# Patient Record
Sex: Female | Born: 1980 | Hispanic: Yes | Marital: Single | Smoking: Never smoker
Health system: Southern US, Community
[De-identification: ages and names within clinical notes are randomized; demographics above are authoritative.]

## PROBLEM LIST (undated history)

## (undated) ENCOUNTER — Inpatient Hospital Stay (HOSPITAL_COMMUNITY): Payer: Self-pay

## (undated) DIAGNOSIS — Z789 Other specified health status: Secondary | ICD-10-CM

---

## 2001-01-29 ENCOUNTER — Inpatient Hospital Stay (HOSPITAL_COMMUNITY): Admission: AD | Admit: 2001-01-29 | Discharge: 2001-01-29 | Payer: Self-pay | Admitting: *Deleted

## 2001-01-29 ENCOUNTER — Encounter: Payer: Self-pay | Admitting: Obstetrics

## 2001-04-30 ENCOUNTER — Encounter: Admission: RE | Admit: 2001-04-30 | Discharge: 2001-04-30 | Payer: Self-pay | Admitting: Family Medicine

## 2001-05-30 ENCOUNTER — Encounter: Admission: RE | Admit: 2001-05-30 | Discharge: 2001-05-30 | Payer: Self-pay | Admitting: Family Medicine

## 2001-06-09 ENCOUNTER — Ambulatory Visit (HOSPITAL_COMMUNITY): Admission: RE | Admit: 2001-06-09 | Discharge: 2001-06-09 | Payer: Self-pay

## 2001-07-22 ENCOUNTER — Encounter: Admission: RE | Admit: 2001-07-22 | Discharge: 2001-07-22 | Payer: Self-pay | Admitting: Sports Medicine

## 2001-08-29 ENCOUNTER — Encounter: Admission: RE | Admit: 2001-08-29 | Discharge: 2001-08-29 | Payer: Self-pay | Admitting: Family Medicine

## 2001-09-05 ENCOUNTER — Encounter: Admission: RE | Admit: 2001-09-05 | Discharge: 2001-09-05 | Payer: Self-pay | Admitting: Family Medicine

## 2001-09-10 ENCOUNTER — Encounter: Admission: RE | Admit: 2001-09-10 | Discharge: 2001-09-10 | Payer: Self-pay | Admitting: Family Medicine

## 2001-09-19 ENCOUNTER — Encounter: Admission: RE | Admit: 2001-09-19 | Discharge: 2001-09-19 | Payer: Self-pay | Admitting: Family Medicine

## 2001-09-26 ENCOUNTER — Encounter: Admission: RE | Admit: 2001-09-26 | Discharge: 2001-09-26 | Payer: Self-pay | Admitting: Family Medicine

## 2001-09-27 ENCOUNTER — Inpatient Hospital Stay (HOSPITAL_COMMUNITY): Admission: AD | Admit: 2001-09-27 | Discharge: 2001-09-27 | Payer: Self-pay | Admitting: *Deleted

## 2001-09-29 ENCOUNTER — Inpatient Hospital Stay (HOSPITAL_COMMUNITY): Admission: AD | Admit: 2001-09-29 | Discharge: 2001-10-04 | Payer: Self-pay | Admitting: *Deleted

## 2002-04-11 ENCOUNTER — Emergency Department (HOSPITAL_COMMUNITY): Admission: EM | Admit: 2002-04-11 | Discharge: 2002-04-11 | Payer: Self-pay | Admitting: Emergency Medicine

## 2002-05-03 ENCOUNTER — Emergency Department (HOSPITAL_COMMUNITY): Admission: EM | Admit: 2002-05-03 | Discharge: 2002-05-04 | Payer: Self-pay | Admitting: Emergency Medicine

## 2003-12-09 ENCOUNTER — Ambulatory Visit (HOSPITAL_COMMUNITY): Admission: RE | Admit: 2003-12-09 | Discharge: 2003-12-09 | Payer: Self-pay | Admitting: *Deleted

## 2004-04-13 ENCOUNTER — Ambulatory Visit: Payer: Self-pay | Admitting: *Deleted

## 2004-04-13 ENCOUNTER — Inpatient Hospital Stay (HOSPITAL_COMMUNITY): Admission: AD | Admit: 2004-04-13 | Discharge: 2004-04-17 | Payer: Self-pay | Admitting: *Deleted

## 2007-12-20 ENCOUNTER — Other Ambulatory Visit: Payer: Self-pay | Admitting: Emergency Medicine

## 2007-12-20 ENCOUNTER — Ambulatory Visit: Payer: Self-pay | Admitting: Gynecology

## 2007-12-20 ENCOUNTER — Inpatient Hospital Stay (HOSPITAL_COMMUNITY): Admission: AD | Admit: 2007-12-20 | Discharge: 2007-12-24 | Payer: Self-pay | Admitting: Obstetrics & Gynecology

## 2007-12-23 ENCOUNTER — Encounter (INDEPENDENT_AMBULATORY_CARE_PROVIDER_SITE_OTHER): Payer: Self-pay | Admitting: Gynecology

## 2008-02-09 ENCOUNTER — Inpatient Hospital Stay (HOSPITAL_COMMUNITY): Admission: AD | Admit: 2008-02-09 | Discharge: 2008-02-09 | Payer: Self-pay | Admitting: Family Medicine

## 2009-11-03 ENCOUNTER — Emergency Department (HOSPITAL_COMMUNITY): Admission: EM | Admit: 2009-11-03 | Discharge: 2009-11-03 | Payer: Self-pay | Admitting: Family Medicine

## 2009-12-19 ENCOUNTER — Inpatient Hospital Stay (HOSPITAL_COMMUNITY): Admission: AD | Admit: 2009-12-19 | Discharge: 2009-12-19 | Payer: Self-pay | Admitting: Family Medicine

## 2010-02-17 ENCOUNTER — Inpatient Hospital Stay (HOSPITAL_COMMUNITY)
Admission: AD | Admit: 2010-02-17 | Discharge: 2010-02-18 | Payer: Self-pay | Source: Home / Self Care | Admitting: Obstetrics & Gynecology

## 2010-02-17 ENCOUNTER — Ambulatory Visit: Payer: Self-pay | Admitting: Obstetrics & Gynecology

## 2010-03-01 ENCOUNTER — Ambulatory Visit: Payer: Self-pay | Admitting: Obstetrics and Gynecology

## 2010-03-01 LAB — CONVERTED CEMR LAB
Clue Cells Wet Prep HPF POC: NONE SEEN
Trich, Wet Prep: NONE SEEN

## 2010-03-02 ENCOUNTER — Encounter: Payer: Self-pay | Admitting: Obstetrics and Gynecology

## 2010-03-02 LAB — CONVERTED CEMR LAB
Antibody Screen: NEGATIVE
Basophils Absolute: 0 K/uL
Basophils Relative: 0 %
Eosinophils Absolute: 0.4 K/uL
Eosinophils Relative: 4 %
HCT: 41.8 %
Hemoglobin: 13.7 g/dL
Hepatitis B Surface Ag: NEGATIVE
Hgb A2 Quant: 2.6 %
Hgb A: 97.4 %
Hgb F Quant: 0 %
Hgb S Quant: 0 %
Lymphocytes Relative: 21 %
Lymphs Abs: 2.1 K/uL
MCHC: 32.8 g/dL
MCV: 90.7 fL
Monocytes Absolute: 0.5 K/uL
Monocytes Relative: 5 %
Neutro Abs: 7.2 K/uL
Neutrophils Relative %: 70 %
Platelets: 220 K/uL
RBC: 4.61 M/uL
RDW: 15 %
Rh Type: POSITIVE
Rubella: >500 [IU]/mL — ABNORMAL HIGH
WBC: 10.2 10*3/microliter

## 2010-03-06 ENCOUNTER — Ambulatory Visit: Payer: Self-pay | Admitting: Obstetrics & Gynecology

## 2010-03-13 ENCOUNTER — Ambulatory Visit (HOSPITAL_COMMUNITY): Admission: RE | Admit: 2010-03-13 | Discharge: 2010-03-13 | Payer: Self-pay | Admitting: Anesthesiology

## 2010-03-20 ENCOUNTER — Encounter (INDEPENDENT_AMBULATORY_CARE_PROVIDER_SITE_OTHER): Payer: Self-pay | Admitting: *Deleted

## 2010-03-20 ENCOUNTER — Ambulatory Visit: Payer: Self-pay | Admitting: Obstetrics and Gynecology

## 2010-03-20 LAB — CONVERTED CEMR LAB
Albumin: 3.4 g/dL — ABNORMAL LOW (ref 3.5–5.2)
Alkaline Phosphatase: 58 units/L (ref 39–117)
BUN: 8 mg/dL (ref 6–23)
CO2: 21 meq/L (ref 19–32)
Calcium: 8.5 mg/dL (ref 8.4–10.5)
Chloride: 109 meq/L (ref 96–112)
Creatinine, Ser: 0.4 mg/dL (ref 0.40–1.20)
Potassium: 4.1 meq/L (ref 3.5–5.3)
Total Bilirubin: 0.3 mg/dL (ref 0.3–1.2)
Total Protein: 6.2 g/dL (ref 6.0–8.3)

## 2010-04-03 ENCOUNTER — Ambulatory Visit: Payer: Self-pay | Admitting: Obstetrics & Gynecology

## 2010-04-04 ENCOUNTER — Encounter: Payer: Self-pay | Admitting: Obstetrics & Gynecology

## 2010-04-10 ENCOUNTER — Ambulatory Visit: Payer: Self-pay | Admitting: Obstetrics & Gynecology

## 2010-04-11 ENCOUNTER — Encounter: Payer: Self-pay | Admitting: Obstetrics & Gynecology

## 2010-04-11 ENCOUNTER — Ambulatory Visit (HOSPITAL_COMMUNITY): Admission: RE | Admit: 2010-04-11 | Discharge: 2010-04-11 | Payer: Self-pay | Admitting: Family Medicine

## 2010-04-17 ENCOUNTER — Ambulatory Visit: Payer: Self-pay | Admitting: Family Medicine

## 2010-04-24 ENCOUNTER — Encounter: Payer: Self-pay | Admitting: Obstetrics & Gynecology

## 2010-04-24 ENCOUNTER — Ambulatory Visit: Payer: Self-pay | Admitting: Obstetrics and Gynecology

## 2010-04-24 LAB — CONVERTED CEMR LAB
Hemoglobin: 12.8 g/dL (ref 12.0–15.0)
MCHC: 34 g/dL (ref 30.0–36.0)
MCV: 91.3 fL (ref 78.0–100.0)
RDW: 13.9 % (ref 11.5–15.5)

## 2010-04-25 ENCOUNTER — Encounter: Payer: Self-pay | Admitting: Obstetrics & Gynecology

## 2010-04-26 ENCOUNTER — Encounter: Payer: Self-pay | Admitting: Obstetrics & Gynecology

## 2010-04-26 ENCOUNTER — Ambulatory Visit: Payer: Self-pay | Admitting: Obstetrics and Gynecology

## 2010-05-08 ENCOUNTER — Ambulatory Visit: Payer: Self-pay | Admitting: Obstetrics & Gynecology

## 2010-05-09 ENCOUNTER — Ambulatory Visit (HOSPITAL_COMMUNITY): Admission: RE | Admit: 2010-05-09 | Discharge: 2010-05-09 | Payer: Self-pay | Admitting: Family Medicine

## 2010-05-15 ENCOUNTER — Ambulatory Visit: Payer: Self-pay | Admitting: Family Medicine

## 2010-05-15 LAB — CONVERTED CEMR LAB
Trich, Wet Prep: NONE SEEN
Yeast Wet Prep HPF POC: NONE SEEN

## 2010-05-29 ENCOUNTER — Encounter (INDEPENDENT_AMBULATORY_CARE_PROVIDER_SITE_OTHER): Payer: Self-pay | Admitting: *Deleted

## 2010-05-29 ENCOUNTER — Ambulatory Visit: Payer: Self-pay | Admitting: Obstetrics & Gynecology

## 2010-05-29 LAB — CONVERTED CEMR LAB

## 2010-06-05 ENCOUNTER — Ambulatory Visit: Payer: Self-pay | Admitting: Obstetrics and Gynecology

## 2010-06-19 ENCOUNTER — Ambulatory Visit: Payer: Self-pay | Admitting: Obstetrics & Gynecology

## 2010-06-20 ENCOUNTER — Encounter: Payer: Self-pay | Admitting: Obstetrics & Gynecology

## 2010-06-21 ENCOUNTER — Inpatient Hospital Stay (HOSPITAL_COMMUNITY): Admission: AD | Admit: 2010-06-21 | Discharge: 2010-06-22 | Payer: Self-pay | Admitting: Obstetrics and Gynecology

## 2010-06-21 ENCOUNTER — Ambulatory Visit: Payer: Self-pay | Admitting: Family Medicine

## 2010-06-26 ENCOUNTER — Ambulatory Visit: Payer: Self-pay | Admitting: Family Medicine

## 2010-07-03 ENCOUNTER — Ambulatory Visit: Payer: Self-pay | Admitting: Obstetrics & Gynecology

## 2010-07-10 ENCOUNTER — Ambulatory Visit: Payer: Self-pay | Admitting: Obstetrics and Gynecology

## 2010-07-11 ENCOUNTER — Inpatient Hospital Stay (HOSPITAL_COMMUNITY)
Admission: AD | Admit: 2010-07-11 | Discharge: 2010-07-11 | Payer: Self-pay | Source: Home / Self Care | Admitting: Obstetrics & Gynecology

## 2010-07-11 ENCOUNTER — Ambulatory Visit: Payer: Self-pay | Admitting: Family

## 2010-07-12 ENCOUNTER — Ambulatory Visit: Payer: Self-pay | Admitting: Obstetrics and Gynecology

## 2010-07-12 ENCOUNTER — Inpatient Hospital Stay (HOSPITAL_COMMUNITY)
Admission: AD | Admit: 2010-07-12 | Discharge: 2010-07-14 | Payer: Self-pay | Source: Home / Self Care | Admitting: Obstetrics & Gynecology

## 2010-08-16 ENCOUNTER — Ambulatory Visit: Payer: Self-pay | Admitting: Obstetrics and Gynecology

## 2010-08-16 ENCOUNTER — Encounter: Payer: Self-pay | Admitting: Obstetrics and Gynecology

## 2010-08-16 LAB — CONVERTED CEMR LAB
MCV: 90.4 fL (ref 78.0–100.0)
RBC: 4.25 M/uL (ref 3.87–5.11)
WBC: 6.9 10*3/uL (ref 4.0–10.5)

## 2010-08-30 ENCOUNTER — Ambulatory Visit
Admission: RE | Admit: 2010-08-30 | Discharge: 2010-08-30 | Payer: Self-pay | Source: Home / Self Care | Attending: Obstetrics and Gynecology | Admitting: Obstetrics and Gynecology

## 2010-11-07 LAB — RPR: RPR Ser Ql: NONREACTIVE

## 2010-11-07 LAB — POCT URINALYSIS DIPSTICK
Bilirubin Urine: NEGATIVE
Bilirubin Urine: NEGATIVE
Glucose, UA: 100 mg/dL — AB
Ketones, ur: NEGATIVE mg/dL
Protein, ur: NEGATIVE mg/dL
Specific Gravity, Urine: 1.025 (ref 1.005–1.030)
Specific Gravity, Urine: >=1.03 (ref 1.005–1.030)
Urobilinogen, UA: 0.2 mg/dL (ref 0.0–1.0)

## 2010-11-07 LAB — CBC
HCT: 40.9 % (ref 36.0–46.0)
MCH: 31.3 pg (ref 26.0–34.0)
MCV: 92 fL (ref 78.0–100.0)
RBC: 4.45 MIL/uL (ref 3.87–5.11)
RDW: 12.9 % (ref 11.5–15.5)

## 2010-11-08 LAB — POCT URINALYSIS DIPSTICK
Bilirubin Urine: NEGATIVE
Bilirubin Urine: NEGATIVE
Glucose, UA: >=1000 mg/dL — AB
Glucose, UA: NEGATIVE mg/dL
Ketones, ur: NEGATIVE mg/dL
Nitrite: NEGATIVE
Nitrite: NEGATIVE
Protein, ur: NEGATIVE mg/dL
Protein, ur: NEGATIVE mg/dL
Specific Gravity, Urine: >=1.03 (ref 1.005–1.030)
Urobilinogen, UA: 1 mg/dL (ref 0.0–1.0)
pH: 6 (ref 5.0–8.0)
pH: 6 (ref 5.0–8.0)

## 2010-11-09 LAB — POCT URINALYSIS DIPSTICK
Bilirubin Urine: NEGATIVE
Bilirubin Urine: NEGATIVE
Bilirubin Urine: NEGATIVE
Glucose, UA: NEGATIVE mg/dL
Glucose, UA: NEGATIVE mg/dL
Ketones, ur: NEGATIVE mg/dL
Ketones, ur: NEGATIVE mg/dL
Nitrite: NEGATIVE
Nitrite: NEGATIVE
Protein, ur: NEGATIVE mg/dL
Protein, ur: NEGATIVE mg/dL
Specific Gravity, Urine: 1.02 (ref 1.005–1.030)
Specific Gravity, Urine: 1.015 (ref 1.005–1.030)
Specific Gravity, Urine: 1.015 (ref 1.005–1.030)
Urobilinogen, UA: 0.2 mg/dL (ref 0.0–1.0)
pH: 5.5 (ref 5.0–8.0)
pH: 6.5 (ref 5.0–8.0)
pH: 6.5 (ref 5.0–8.0)

## 2010-11-11 LAB — POCT URINALYSIS DIP (DEVICE)
Bilirubin Urine: NEGATIVE
Ketones, ur: NEGATIVE mg/dL
Nitrite: NEGATIVE
Specific Gravity, Urine: 1.025 (ref 1.005–1.030)
Urobilinogen, UA: 0.2 mg/dL (ref 0.0–1.0)
pH: 6 (ref 5.0–8.0)

## 2010-11-12 LAB — URINALYSIS, ROUTINE W REFLEX MICROSCOPIC
Bilirubin Urine: NEGATIVE
Glucose, UA: NEGATIVE mg/dL
Ketones, ur: NEGATIVE mg/dL
Nitrite: NEGATIVE
Specific Gravity, Urine: 1.025 (ref 1.005–1.030)

## 2010-11-12 LAB — POCT URINALYSIS DIP (DEVICE): pH: 5.5 (ref 5.0–8.0)

## 2010-11-12 LAB — CBC
Hemoglobin: 12.9 g/dL (ref 12.0–15.0)
MCH: 31.2 pg (ref 26.0–34.0)
MCHC: 34.6 g/dL (ref 30.0–36.0)
RDW: 14.2 % (ref 11.5–15.5)
WBC: 11.3 10*3/uL — ABNORMAL HIGH (ref 4.0–10.5)

## 2010-11-12 LAB — GC/CHLAMYDIA PROBE AMP, GENITAL
Chlamydia, DNA Probe: NEGATIVE
GC Probe Amp, Genital: NEGATIVE

## 2010-11-12 LAB — WET PREP, GENITAL: Trich, Wet Prep: NONE SEEN

## 2010-11-12 LAB — SURGICAL PCR SCREEN: MRSA, PCR: NEGATIVE

## 2010-11-14 LAB — WET PREP, GENITAL: Yeast Wet Prep HPF POC: NONE SEEN

## 2010-11-20 LAB — POCT RAPID STREP A (OFFICE): Streptococcus, Group A Screen (Direct): POSITIVE — AB

## 2011-01-09 NOTE — Op Note (Signed)
Amy [name], Amy [name]           ACCOUNT NO.:  [id number]   MEDICAL RECORD NO.:  [id number]          PATIENT TYPE:  INP   LOCATION:                                FACILITY:  [facility]   PHYSICIAN:  Ginger Carne, MD  DATE OF BIRTH:  May 04, 1981   DATE OF PROCEDURE:  12/23/2007  DATE OF DISCHARGE:                               OPERATIVE REPORT   PREOPERATIVE DIAGNOSIS:  An 18-week incomplete abortion with retained  placenta.   POSTOPERATIVE DIAGNOSIS:  An 18-week incomplete abortion with retained  placenta.   PROCEDURE:  1. Aspiration.  2. Dilation.  3. Extraction.   SURGEON:  Ginger Carne, MD   ASSISTANT:  None.   COMPLICATIONS:  None immediate.   ESTIMATED BLOOD LOSS:  Less than 100 mL.   ANESTHESIA:  General.   OPERATIVE FINDINGS:  Fetus had been expelled prior to procedure and  placental tissue noted.  All tissue was removed and minimal bleeding  noted at the end of the procedure.   SPECIMENS TO PATHOLOGY:  The patient is Rh positive.  Uterus palpated to  approximately 16 weeks.  Both adnexa appeared to be normal.  External  genitalia, vulva, and vagina were normal.  Cervix was smooth without  erosions or lesions.   OPERATIVE PROCEDURE:  The patient prepped and draped in usual fashion  and placed in the lithotomy position.  Betadine solution used for  antiseptic and the patient was catheterized prior to the procedure.  After adequate general anesthesia, tenaculum placed on the anterior lip  of the cervix.  A #12 suction cannulae utilized including a banjo  curette to remove intrauterine contents.  All placental tissue removed,  sent to pathology.  Minimal bleeding noted at the end of the procedure.  The patient tolerated the procedure well and returned to the post  anesthesia recovery room in excellent condition.     Ginger Carne, MD  Electronically Signed    SHB/MEDQ  D:  12/23/2007  T:  12/24/2007  Job:  [id number]

## 2011-01-09 NOTE — Discharge Summary (Signed)
NAMEADRIENNA, Amy [name]           ACCOUNT NO.:  [id number]   MEDICAL RECORD NO.:  [id number]          PATIENT TYPE:  INP   LOCATION:  9315                          FACILITY:  [facility]   PHYSICIAN:  Ginger Carne, MD  DATE OF BIRTH:  1981-04-04   DATE OF ADMISSION:  12/20/2007  DATE OF DISCHARGE:                               DISCHARGE SUMMARY   REASON FOR HOSPITALIZATION:  18 week second trimester cervical  incompetence.   IN HOSPITAL PROCEDURES:  Dilation and curettage following incomplete  abortion.   FINAL DIAGNOSIS:  Second trimester incomplete abortion and cervical  incompetence   HOSPITAL COURSE:  This is a 30 year old Hispanic female, Rh positive,  who presented with a chief complaint of pelvic pressure, on the December 20, 2007.  On pelvic examination, the patient was noted to have no  visible cervix with hour glassing of the membranes.  She was dated at 18  weeks.  Her cervix was approximately 5 cm dilated, at the time.  The  patient had a spontaneously delivered said fetus, on December 23, 2007.  She was given Cytotec complementation for delivery of the placenta,  which did not occur, and in the late afternoon of December 23, 2007, the  patient was taken to the operating room, at which time a dilation and  curettage with suction was performed.  All contents removed.  Postoperatively, the patient did well.  She was afebrile, voided well.  Scant vaginal flow and no abdominal or uterine tenderness.   The patient's blood type was A positive.  She had a negative Chlamydia  and gonorrhea probe, and her hemoglobin, on admission was 12.2.   The patient was requested to follow up at [facility], [facility], in 4-6 weeks, for follow up and for  discussion of contraception.  In addition, she was requested to take 600  mg of ibuprofen every 6 hours, as needed for pain.  All questions were  answered.  Instructions were given to the Spanish interpreter.      Ginger Carne, MD  Electronically Signed     SHB/MEDQ  D:  12/24/2007  T:  12/24/2007  Job:  [id number]

## 2011-01-12 NOTE — Discharge Summary (Signed)
[facility]  Patient:    Amy [name], Amy [name] Visit Number: [id number] MRN: [id number]          Service Type: OBS Location: [id number] Attending Physician:  Michaelle Copas Dictated by:   Kevin Fenton, M.D. Admit Date:  09/29/2001 Discharge Date: 10/04/2001                             Discharge Summary  DISCHARGE MEDICATIONS:        1. Augmentin 875 mg p.o. b.i.d. x 10 days.                               2. Percocet 5/325 two pills q.4h. p.r.n. severe                                  pain.                               3. Ibuprofen 600 mg one pill every six hours                                  as needed.                               4. Prenatal vitamin one a day until the bottle                                  she has is finished.  DISCHARGE FOLLOWUP:           The patient should follow up at the [facility] in six weeks.  DISCHARGE DIAGNOSES:          1. Term pregnancy, delivered.                               2. Primary low transverse cesarean section.                               3. Failure to progress.  BRIEF HOSPITAL COURSE:        This is a 30 year old, G1, now P55, who presented in active labor on September 29, 2001. She underwent spontaneous rupture of membranes. She is GBS negative. The patient did receive an epidural. Her labor was augmented with Pitocin. However, the patient had failure to progress past 9 cm and so the patient was taken to the OR for a primary low transverse cesarean section. The patient delivered a 9-pound 2-ounce female with Apgars of 9 at one minute and 9 at five minutes under epidural anesthesia. She had a postoperative course complicated by fever for which she received Cefotetan x three days. She was afebrile x 48 hours at discharge. She is breast and bottle feeding. She desires no contraception. She is A positive, rubella immune. She does not desire circumcision for the  baby. Dictated by:   Kevin Fenton, M.D. Attending Physician:  Michaelle Copas DD:  10/04/01 TD:  10/05/01  Job: [id number]

## 2011-01-12 NOTE — Discharge Summary (Signed)
[name], [name]           ACCOUNT NO.:  [id number]   MEDICAL RECORD NO.:  [id number]           PATIENT TYPE:   LOCATION:                                 FACILITY:   PHYSICIAN:  Adrian Blackwater, MDDATE OF BIRTH:  1981-07-10   DATE OF ADMISSION:  04/13/2004  DATE OF DISCHARGE:  04/17/2004                                 DISCHARGE SUMMARY   This is a 30 year old, G2, P1-1 who presents at 49 weeks and 2 days  gestational age with spontaneous rupture of membranes.  Patient has prior  history of LGA infant and anemia and previous cesarean section due to arrest  of dilation.  Baby's weight was 9 pounds 20 ounces.  This was a pregnancy  with late prenatal care at 21 weeks and anemia but no other complications.  Patient was admitted for expectant management.  She was started on Pitocin  to accelerate labor.  Also an epidural was placed by request.  Labor  progressed slowly with fetal brady that reassured after scalp stimulation.  During active phase of labor, patient developed temperature up to 98 degrees  to 101.6 on April 14, 2004, at 17:35 with fetal tachycardia.  Ampicillin  plus gentamicin was started with expectant management.  Patient delivered on  April 14, 2004 by vaginal delivery with third-degree lacerations and  repair.  In the last 24 hours patient has oliguria probably secondary to  urinary retention and dehydration.  She was bolused one liter of lactated  ringers with post-bolus voiding normal.  During postpartum day #2, the  patient has orthostatic change.  Lochia was normal.  There was only mild  tenderness on the abdomen and patient remained afebrile.   DISCHARGE LABS:  White blood cell 14.4, hemoglobin 8.1, hematocrit 23.8,  platelets 194, neutrophils 12.7.   OBSTETRICAL DELIVERY RECORD:  Patient delivered on April 14, 2004, at 2000  hours female baby with Apgar 7 at 1 minute, 8 at 5 minutes, also temperature  101.2.  Baby's weight was 6 pounds and 12 ounces  or 3065 grams.  He was  delivered by forceps-assisted vaginal delivery by Dr. Lelon Huh and there  was an occipital hematoma noted at delivery that was later evaluated as a  possible hemangioma with followup.   DISCHARGE MEDICATIONS:  1.  Percocet 5 and 325 mg 2 tablets p.o. q.4-6h. p.r.n. pain.  2.  Ibuprofen 600 mg 1 p.o. q.6h. pain.  3.  For anemia, ferrous sulfate 1 tablet b.i.d.   FOLLOWUP:  A follow-up appointment was scheduled in 6 weeks at [facility].   CONTRACEPTION:  Patient received Depo shot.            ___________________________________________  Adrian Blackwater, MD    IM/MEDQ  D:  07/05/2004  T:  07/06/2004  Job:  [id number]

## 2011-01-12 NOTE — Op Note (Signed)
[facility]  Patient:    Amy [name], Amy [name] Visit Number: [id number] MRN: [id number]          Service Type: OBS Location: [phone] Attending Physician:  Michaelle Copas Dictated by:   Georgina Peer, M.D. Proc. Date: 09/30/01 Admit Date:  09/29/2001   CC:         Conni Elliot, M.D.   Operative Report  PREOPERATIVE DIAGNOSES:       1. Term intrauterine pregnancy in active phase                                  of arrest.                               2. Failure to progress.                               3. Suspected macrosomia.                               4. Maternal fever.  POSTOPERATIVE DIAGNOSES:      1. Term intrauterine pregnancy in active phase                                  of arrest.                               2. Failure to progress.                               3. Suspected macrosomia.                               4. Maternal fever.  OPERATION:                    Low cervical transverse cesarean section.  SURGEON:                      Georgina Peer, M.D.  ASSISTANT:                    Kevin Fenton, M.D.  ANESTHESIA:                   Spinal - Leilani Able, Montez Hageman., M.D.  ESTIMATED BLOOD LOSS:         1200 cc.  FINDINGS:                     A 9 pound 2 ounce female delivered at 4:01 a.m. Apgars 9 and 9.  Extension of the right edge of the incision inferiorly due to macrosomia and had impactment.  HISTORY:                      This is a 30 year old Hispanic female who presented in labor.  Progressed to anterior lip.  Did not progress past that. Had molding and caput.  Adequate contractions were documented with IUPC.  The patient  had a temperature as high as 100.8 and was started on Unasyn and received one dose.  Fetal heart rate was reactive.  Fluid was clear.  Active phase arrest was diagnosed and the patient through her interpreter was apprised of the desire to perform cesarean  section.  Risks and complications of operation were discussed with the patient including bleeding, infection, bowel, bladder, vascular injury, uterine injury, blood clots, postoperative recovery, and pain.  Questions were answered and she was taken to the operating room.  She had received an epidural in labor and a Foley catheter in labor.  DESCRIPTION OF PROCEDURE:     The patient was taken to the operating room, placed in the dorsal supine left lateral uterine displacement.  After adequate anesthesia was documented with an Allis test a transverse skin incision was made and taken into the peritoneal cavity in a normal Pfannenstiel. Transverse bladder flap was created.  Transverse uterine incision made.  Clear fluid came through the incision and at 4:01 a female infant was dislodged from the pelvis in a transverse presentation.  The head was impacted in the pelvis and needed be dislodged.  In the process the right edge of the incision was extended.  The infant was delivered, cried spontaneously.  Cord was doubly clamped and cut.  Infant handed to the pediatric team in attendance.  Weight was 9 pounds 2 ounces.  Apgars 9 and 9.  Uterine extension was sewed in a running locked suture which gave excellent hemostasis.  The rest of the uterine incision was closed in a running locked suture of Vicryl with one matris suture to control bleeding.  There was good hemostasis.  There was uterine atone which responded to uterine massage and methergine and IV Pitocin.  However, excessive blood loss was noted with estimated blood loss 1200 cc.  The patient was hemodynamically stable.  With the uterine incision closed tubes and ovaries were inspected and found to be normal.  The area over the bladder was inspected and found to be normal.  The peritoneum was closed. The fascia was closed with Vicryl.  Subcutaneous tissue cauterized for bleeding.  Skin closed with skin staples.  The patient received 1 g of  Ancef after the cord was clamped.  Sponge, needle, and instrument counts were correct.  The patient transferred to recovery area in stable condition. Dictated by:   Georgina Peer, M.D. Attending Physician:  Michaelle Copas DD:  09/30/01 TD:  09/30/01 Job: [id number]

## 2011-01-30 ENCOUNTER — Inpatient Hospital Stay (INDEPENDENT_AMBULATORY_CARE_PROVIDER_SITE_OTHER)
Admission: RE | Admit: 2011-01-30 | Discharge: 2011-01-30 | Disposition: A | Discharge status: Home / Self Care | Payer: Self-pay | Source: Ambulatory Visit | Attending: Emergency Medicine | Admitting: Emergency Medicine

## 2011-01-30 DIAGNOSIS — J029 Acute pharyngitis, unspecified: Secondary | ICD-10-CM

## 2011-01-30 DIAGNOSIS — B9789 Other viral agents as the cause of diseases classified elsewhere: Secondary | ICD-10-CM

## 2011-01-30 LAB — POCT PREGNANCY, URINE: Preg Test, Ur: NEGATIVE

## 2011-01-30 LAB — POCT I-STAT, CHEM 8
BUN: 15 mg/dL (ref 6–23)
Calcium, Ion: 1.2 mmol/L (ref 1.12–1.32)
Chloride: 104 mEq/L (ref 96–112)
Creatinine, Ser: 0.7 mg/dL (ref 0.4–1.2)
Glucose, Bld: 72 mg/dL (ref 70–99)
HCT: 41 % (ref 36.0–46.0)
Hemoglobin: 13.9 g/dL (ref 12.0–15.0)
Potassium: 3.3 mEq/L — ABNORMAL LOW (ref 3.5–5.1)
Sodium: 143 mEq/L (ref 135–145)
TCO2: 27 mmol/L (ref 0–100)

## 2011-01-30 LAB — POCT URINALYSIS DIP (DEVICE)
Ketones, ur: NEGATIVE mg/dL
Protein, ur: 30 mg/dL — AB
Specific Gravity, Urine: >=1.03 (ref 1.005–1.030)

## 2011-01-30 LAB — POCT RAPID STREP A: Streptococcus, Group A Screen (Direct): NEGATIVE

## 2011-05-22 LAB — CBC
HCT: 35 — ABNORMAL LOW
MCHC: 34.6
MCHC: 34.7
MCHC: 34.3
MCV: 91.2
Platelets: 186
Platelets: 203
Platelets: 201
RBC: 3.9
RDW: 12
RDW: 12.5
WBC: 11.3 — ABNORMAL HIGH

## 2011-05-22 LAB — URINALYSIS, ROUTINE W REFLEX MICROSCOPIC
Bilirubin Urine: NEGATIVE
Glucose, UA: NEGATIVE
Hgb urine dipstick: NEGATIVE
Hgb urine dipstick: NEGATIVE
Ketones, ur: >80 — AB
Ketones, ur: 40 — AB
Nitrite: NEGATIVE
Protein, ur: NEGATIVE
Protein, ur: NEGATIVE
Urobilinogen, UA: 0.2
Urobilinogen, UA: 0.2

## 2011-05-22 LAB — DIFFERENTIAL
Basophils Relative: 1
Eosinophils Absolute: 0.2
Lymphs Abs: 2.1
Monocytes Relative: 5
Neutro Abs: 8.4 — ABNORMAL HIGH
Neutrophils Relative %: 74

## 2011-05-22 LAB — URINE CULTURE
Colony Count: NO GROWTH
Culture: NO GROWTH
Special Requests: NEGATIVE

## 2011-05-22 LAB — URINE MICROSCOPIC-ADD ON

## 2011-05-22 LAB — RPR: RPR Ser Ql: NONREACTIVE

## 2011-05-22 LAB — HEPATITIS B SURFACE ANTIGEN: Hepatitis B Surface Ag: NEGATIVE

## 2011-05-22 LAB — BASIC METABOLIC PANEL
BUN: 5 — ABNORMAL LOW
CO2: 21
Calcium: 8.6
GFR calc non Af Amer: >60
Glucose, Bld: 74

## 2011-05-22 LAB — GC/CHLAMYDIA PROBE AMP, GENITAL
Chlamydia, DNA Probe: NEGATIVE
GC Probe Amp, Genital: NEGATIVE

## 2011-05-22 LAB — RUBELLA SCREEN: Rubella: >500 — ABNORMAL HIGH

## 2011-05-22 LAB — WET PREP, GENITAL

## 2011-05-22 LAB — TYPE AND SCREEN

## 2011-05-24 LAB — URINALYSIS, ROUTINE W REFLEX MICROSCOPIC
Glucose, UA: NEGATIVE
Ketones, ur: NEGATIVE
Leukocytes, UA: NEGATIVE
Nitrite: NEGATIVE
Specific Gravity, Urine: >1.03 — ABNORMAL HIGH
pH: 5

## 2011-05-24 LAB — GC/CHLAMYDIA PROBE AMP, GENITAL
Chlamydia, DNA Probe: NEGATIVE
GC Probe Amp, Genital: NEGATIVE

## 2011-05-24 LAB — CBC
MCHC: 34
Platelets: 228
RBC: 4.3
RDW: 12.8

## 2011-05-24 LAB — URINE MICROSCOPIC-ADD ON

## 2011-10-25 ENCOUNTER — Encounter (HOSPITAL_COMMUNITY): Payer: Self-pay | Admitting: *Deleted

## 2011-10-25 ENCOUNTER — Emergency Department (INDEPENDENT_AMBULATORY_CARE_PROVIDER_SITE_OTHER)
Admission: EM | Admit: 2011-10-25 | Discharge: 2011-10-25 | Disposition: A | Discharge status: Home / Self Care | Payer: Self-pay | Source: Home / Self Care | Attending: Family Medicine | Admitting: Family Medicine

## 2011-10-25 DIAGNOSIS — M79609 Pain in unspecified limb: Secondary | ICD-10-CM

## 2011-10-25 DIAGNOSIS — M79643 Pain in unspecified hand: Secondary | ICD-10-CM

## 2011-10-25 LAB — POCT I-STAT, CHEM 8
BUN: 14 mg/dL (ref 6–23)
Calcium, Ion: 1.24 mmol/L (ref 1.12–1.32)
Chloride: 108 mEq/L (ref 96–112)
Glucose, Bld: 88 mg/dL (ref 70–99)

## 2011-10-25 MED ORDER — PREDNISONE (PAK) 10 MG PO TABS: ORAL_TABLET | ORAL | Status: DC

## 2011-10-25 NOTE — ED Notes (Signed)
Pt  Reports   Vague  Symptoms  Of  painfull  Joints  With  Some  Mild  Swelling   That  Began yesterday  She  denys  Any  specefic  Injury  Or  Any  Known causative  Agents    She  Is  Ambulatory  To  Room in no  Acute  Distress     Speaking in complete  sentances  And  Ambulates  With a  Fluid  gait

## 2011-10-25 NOTE — Discharge Instructions (Signed)
Take the medication as directed. Follow up if symptoms persist or worsen.

## 2011-10-25 NOTE — ED Provider Notes (Signed)
History     CSN: [id number]  Arrival date & time 10/25/11  1211   First MD Initiated Contact with Patient 10/25/11 1416      Chief Complaint  Patient presents with  . Joint Swelling    (Consider location/radiation/quality/duration/timing/severity/associated sxs/prior treatment) HPI Comments: Thru an interpreter the patient reports a one day hx of pain in both hands with radiation to forearms. No injury. States if feels like her bones will break. Took tylenol with relief. Denies repetitive or unusual activities. She is right hand dom. No prior episodes. Did have tingling in feet 2 yrs ago.   The history is provided by the patient.    History reviewed. No pertinent past medical history.  History reviewed. No pertinent past surgical history.  History reviewed. No pertinent family history.  History  Substance Use Topics  . Smoking status: Not on file  . Smokeless tobacco: Not on file  . Alcohol Use: Not on file    OB History    Grav Para Term Preterm Abortions TAB SAB Ect Mult Living                  Review of Systems  Constitutional: Negative.   HENT: Negative.   Respiratory: Negative.   Cardiovascular: Negative.   Gastrointestinal: Negative.   Genitourinary: Negative.     Allergies  Penicillins  Home Medications   Current Outpatient Rx  Name Route Sig Dispense Refill  . PREDNISONE (PAK) 10 MG PO TABS  Disp one six day pk Take as directed with food 21 tablet 0    BP 108/70  Pulse 72  Temp(Src) 98.6 F (37 C) (Oral)  Resp 16  SpO2 100%  LMP 10/18/2011  Physical Exam  Nursing note and vitals reviewed. Constitutional: She appears well-developed and well-nourished.  HENT:  Head: Normocephalic.  Mouth/Throat: Oropharynx is clear and moist.  Neck: Normal range of motion. Neck supple.  Cardiovascular: Normal rate.   Abdominal: Soft.  Musculoskeletal: Normal range of motion. She exhibits no edema and no tenderness.       Negative tinel's Phalen's.  Normal pulses. Good grip strength. Good cap refill. Radial pulse strong.   Lymphadenopathy:    She has no cervical adenopathy.    ED Course  Procedures (including critical care time)   Labs Reviewed  POCT I-STAT, CHEM 8   No results found.   1. Hand pain       MDM          Randa Spike, MD 10/25/11 1620

## 2012-10-22 ENCOUNTER — Emergency Department (HOSPITAL_COMMUNITY)
Admission: EM | Admit: 2012-10-22 | Discharge: 2012-10-22 | Disposition: A | Discharge status: Home / Self Care | Payer: No Typology Code available for payment source | Source: Home / Self Care | Attending: Emergency Medicine | Admitting: Emergency Medicine

## 2012-10-22 ENCOUNTER — Encounter (HOSPITAL_COMMUNITY): Payer: Self-pay | Admitting: Emergency Medicine

## 2012-10-22 DIAGNOSIS — R110 Nausea: Secondary | ICD-10-CM

## 2012-10-22 LAB — POCT I-STAT, CHEM 8
BUN: 12 mg/dL (ref 6–23)
Calcium, Ion: 1.22 mmol/L (ref 1.12–1.23)
Chloride: 103 mEq/L (ref 96–112)
Creatinine, Ser: 0.6 mg/dL (ref 0.50–1.10)
Sodium: 140 mEq/L (ref 135–145)
TCO2: 27 mmol/L (ref 0–100)

## 2012-10-22 LAB — POCT URINALYSIS DIP (DEVICE)
Hgb urine dipstick: NEGATIVE
Protein, ur: NEGATIVE mg/dL
Specific Gravity, Urine: >=1.03 (ref 1.005–1.030)
Urobilinogen, UA: 0.2 mg/dL (ref 0.0–1.0)

## 2012-10-22 MED ORDER — ONDANSETRON HCL 4 MG PO TABS: 4.0000 mg | ORAL_TABLET | Freq: Three times a day (TID) | ORAL | Status: DC | PRN

## 2012-10-22 MED ORDER — ACETAMINOPHEN-CODEINE #3 300-30 MG PO TABS: 1.0000 | ORAL_TABLET | Freq: Four times a day (QID) | ORAL | Status: DC | PRN

## 2012-10-22 NOTE — ED Provider Notes (Addendum)
placeHistory     CSN: [id number]  Arrival date & time 10/22/12  1431   First MD Initiated Contact with Patient 10/22/12 1444      Chief Complaint  Patient presents with  . Nausea    (Consider location/radiation/quality/duration/timing/severity/associated sxs/prior treatment) HPI Comments: Patient presents urgent care describing for the last few days she's been expressing intermittent headaches and feeling nauseous. At times does feel some mild upper abdominal discomfort ( patient points to epigastric region),  Denies any other symptoms such as vomiting diarrheas weight loss fevers, urinary symptoms such as increased frequency pressure burning. Denies any vaginal bleeding. She has taken some Tylenol in the past which help somewhat her headaches. Denies any visual changes, or further neurological symptoms such as paresthesias or weakness of upper and lower extremities. Denies having a history of migraines but does experience headaches occasionally. Denies any head injury or trauma within the last few months.  Patient is a 32 y.o. female presenting with headaches. The history is provided by the patient.  Headache Pain location:  Generalized Radiates to:  Does not radiate Severity currently:  5/10 Onset quality:  Gradual Timing:  Intermittent Progression:  Waxing and waning Chronicity:  Recurrent Relieved by:  None tried Worsened by:  Nothing tried Ineffective treatments:  None tried Associated symptoms: nausea   Associated symptoms: no abdominal pain, no back pain, no congestion, no cough, no diarrhea, no dizziness, no pain, no fever, no myalgias, no neck pain, no neck stiffness, no photophobia, no seizures, no swollen glands, no syncope, no visual change and no vomiting     History reviewed. No pertinent past medical history.  Past Surgical History  Procedure Laterality Date  . Cesarean section      No family history on file.  History  Substance Use Topics  . Smoking status:  Never Smoker   . Smokeless tobacco: Not on file  . Alcohol Use: No    OB History   Grav Para Term Preterm Abortions TAB SAB Ect Mult Living                  Review of Systems  Constitutional: Negative for fever, chills, activity change and appetite change.  HENT: Positive for mouth sores. Negative for congestion, neck pain and neck stiffness.   Eyes: Negative for photophobia and pain.  Respiratory: Negative for cough and shortness of breath.   Cardiovascular: Negative for chest pain and syncope.  Gastrointestinal: Positive for nausea. Negative for vomiting, abdominal pain and diarrhea.  Genitourinary: Negative for dysuria, frequency, hematuria, flank pain, decreased urine volume, vaginal discharge, vaginal pain and menstrual problem.  Musculoskeletal: Negative for myalgias, back pain and arthralgias.  Skin: Negative for color change and rash.  Neurological: Positive for headaches. Negative for dizziness and seizures.    Allergies  Penicillins  Home Medications   Current Outpatient Rx  Name  Route  Sig  Dispense  Refill  . acetaminophen-codeine (TYLENOL #3) 300-30 MG per tablet   Oral   Take 1-2 tablets by mouth every 6 (six) hours as needed for pain.   15 tablet   0   . ondansetron (ZOFRAN) 4 MG tablet   Oral   Take 1 tablet (4 mg total) by mouth every 8 (eight) hours as needed for nausea.   10 tablet   0   . predniSONE (STERAPRED UNI-PAK) 10 MG tablet      Disp one six day pk Take as directed with food   21 tablet   0  BP 98/48  Pulse 68  Temp(Src) 98.1 F (36.7 C) (Oral)  Resp 16  SpO2 99%  LMP 10/15/2012  Physical Exam  Nursing note and vitals reviewed. Constitutional: She is oriented to person, place, and time. Vital signs are normal. She appears well-developed and well-nourished.  Non-toxic appearance. She does not have a sickly appearance. She does not appear ill. No distress.  HENT:  Head: Normocephalic.  Mouth/Throat: No oropharyngeal  exudate.  Eyes: Conjunctivae are normal. Pupils are equal, round, and reactive to light.  Neck: Neck supple.  Cardiovascular: Normal rate.  Exam reveals no gallop and no friction rub.   No murmur heard. Abdominal: Soft. Bowel sounds are normal. She exhibits no mass. There is no tenderness. There is no rebound and no guarding.  Neurological: She is alert and oriented to person, place, and time. She displays normal reflexes. No cranial nerve deficit or sensory deficit. She exhibits normal muscle tone. Coordination normal.  Skin: No rash noted.    ED Course  Procedures (including critical care time)  Labs Reviewed  POCT URINALYSIS DIP (DEVICE)  POCT PREGNANCY, URINE  POCT I-STAT, CHEM 8   No results found.   1. Nausea   2. Headache       MD area were M  Most likely tensional headaches. At this point is symptoms and exam were not revealing were consistent with other potential etiologies. Patient was comfortable afebrile normal neurological and abdominal exam. Plan of care laboratory tests was unremarkable including a negative pregnancy test. Symptomatic treatment encourage with Tylenol #3 and children. We have discussed symptoms that should warrant further evaluation in the emergency department.       Jimmie Molly, MD 10/22/12 1659  Jimmie Molly, MD 10/22/12 [phone]

## 2012-10-22 NOTE — ED Notes (Addendum)
Pt is here c/o nauseas since Friday of last week Sx include: headache, fatigue, sour taste, loss of appetite due to feeling nauseas, burping frequently, epigastric pain Denies: f/v/d, urinary prob.  Took Ibuprofen 800mg  yest; alleviated the headache but still felt nauseas  She is alert w/no signs of acute distress.

## 2013-02-03 ENCOUNTER — Encounter (HOSPITAL_COMMUNITY): Payer: Self-pay | Admitting: *Deleted

## 2013-02-03 ENCOUNTER — Emergency Department (INDEPENDENT_AMBULATORY_CARE_PROVIDER_SITE_OTHER)
Admission: EM | Admit: 2013-02-03 | Discharge: 2013-02-03 | Disposition: A | Discharge status: Home / Self Care | Payer: Self-pay | Source: Home / Self Care | Attending: Emergency Medicine | Admitting: Emergency Medicine

## 2013-02-03 ENCOUNTER — Emergency Department (INDEPENDENT_AMBULATORY_CARE_PROVIDER_SITE_OTHER): Payer: Self-pay

## 2013-02-03 DIAGNOSIS — R109 Unspecified abdominal pain: Secondary | ICD-10-CM

## 2013-02-03 DIAGNOSIS — K5900 Constipation, unspecified: Secondary | ICD-10-CM

## 2013-02-03 LAB — POCT URINALYSIS DIP (DEVICE)
Bilirubin Urine: NEGATIVE
Hgb urine dipstick: NEGATIVE
Nitrite: NEGATIVE
Specific Gravity, Urine: >=1.03 (ref 1.005–1.030)
pH: 6 (ref 5.0–8.0)

## 2013-02-03 MED ORDER — LACTULOSE 10 GM/15ML PO SOLN: 20.0000 g | Freq: Two times a day (BID) | ORAL | Status: AC

## 2013-02-03 NOTE — ED Notes (Signed)
Pt  Reports  Pain l  Side   With  Nausea     X  2  Weeks    No  Vomiting          Sitting  Upright  On  Exam table  Speaking in  Complete  sentances

## 2013-02-03 NOTE — ED Provider Notes (Signed)
History     CSN: [id number]  Arrival date & time 02/03/13  1426   First MD Initiated Contact with Patient 02/03/13 1455      Chief Complaint  Patient presents with  . Abdominal Pain    (Consider location/radiation/quality/duration/timing/severity/associated sxs/prior treatment) HPI Comments: Patient presents urgent care this afternoon complaining of ongoing left-sided abdominal pain and feeling nauseous. Last night she couldn't sleep because of it. She denies any fevers or urinary symptoms. Patient describes that she has been having this pain for several weeks but at times he gets better. She's been feeling nauseous, no vomiting or diarrhea as. She reports that she has normal bowel movements and describes no blood in her stools.   Patient denies any unintentional weight loss, fevers arthralgias myalgias changes in appetite. No urinary symptoms such as increased frequency pressure burning with urination. No vaginal discharge or bleeding.  Patient is a 32 y.o. female presenting with abdominal pain. The history is provided by the patient.  Abdominal Pain This is a new problem. The problem occurs constantly. The problem has not changed since onset.Associated symptoms include abdominal pain. Pertinent negatives include no chest pain, no headaches and no shortness of breath. Nothing aggravates the symptoms. Nothing relieves the symptoms. She has tried nothing for the symptoms.    History reviewed. No pertinent past medical history.  Past Surgical History  Procedure Laterality Date  . Cesarean section      No family history on file.  History  Substance Use Topics  . Smoking status: Never Smoker   . Smokeless tobacco: Not on file  . Alcohol Use: No    OB History   Grav Para Term Preterm Abortions TAB SAB Ect Mult Living                  Review of Systems  Constitutional: Negative for fever, chills, activity change and appetite change.  Respiratory: Negative for shortness of  breath.   Cardiovascular: Negative for chest pain.  Gastrointestinal: Positive for nausea and abdominal pain. Negative for vomiting, diarrhea, constipation, blood in stool, abdominal distention, anal bleeding and rectal pain.  Musculoskeletal: Negative for arthralgias.  Skin: Negative for color change and rash.  Neurological: Negative for headaches.    Allergies  Penicillins  Home Medications   Current Outpatient Rx  Name  Route  Sig  Dispense  Refill  . acetaminophen-codeine (TYLENOL #3) 300-30 MG per tablet   Oral   Take 1-2 tablets by mouth every 6 (six) hours as needed for pain.   15 tablet   0   . lactulose (CHRONULAC) 10 GM/15ML solution   Oral   Take 30 mLs (20 g total) by mouth 2 (two) times daily.   120 mL   0   . ondansetron (ZOFRAN) 4 MG tablet   Oral   Take 1 tablet (4 mg total) by mouth every 8 (eight) hours as needed for nausea.   10 tablet   0   . predniSONE (STERAPRED UNI-PAK) 10 MG tablet      Disp one six day pk Take as directed with food   21 tablet   0     BP 100/60  Pulse 78  Temp(Src) 98.6 F (37 C) (Oral)  Resp 18  SpO2 100%  LMP 01/21/2013  Physical Exam  Nursing note and vitals reviewed. Constitutional: Vital signs are normal. She appears well-developed and well-nourished.  Non-toxic appearance. She does not have a sickly appearance. She does not appear ill. No distress.  HENT:  Head: Normocephalic.  Mouth/Throat: No oropharyngeal exudate.  Eyes: Conjunctivae are normal.  Neck: Neck supple.  Pulmonary/Chest: Effort normal and breath sounds normal.  Abdominal: Soft. She exhibits no distension and no mass. There is splenomegaly. There is no hepatosplenomegaly or hepatomegaly. There is tenderness in the right lower quadrant, left upper quadrant and left lower quadrant. There is no rigidity, no rebound, no guarding, no CVA tenderness, no tenderness at McBurney's point and negative Murphy's sign.    Neurological: She is alert.  Skin:  No rash noted. No erythema. No pallor.    ED Course  Procedures (including critical care time)  Labs Reviewed  POCT URINALYSIS DIP (DEVICE)  POCT PREGNANCY, URINE   Dg Abd 1 View  02/03/2013   *RADIOLOGY REPORT*  Clinical Data: Abdominal pain particularly the left lower quadrant  ABDOMEN - 1 VIEW  Comparison: None.  Findings: A supine film of the abdomen shows no bowel obstruction. No opaque calculi are noted.  There is feces in the right colon. No bony abnormality is seen.  IMPRESSION: No bowel obstruction.  No opaque calculi.   Original Report Authenticated By: Dwyane Dee, M.D.     1. Constipation   2. Abdominal pain       MDM  Patient looks comfortable. Afebrile. Urine dip was unremarkable as well. X-ray was consistent with significant fecal burden. Have advised patient to take a course of lactulose for the next 3 days. Have discussed with patient symptoms that should warrant further evaluation in the emergency department such as increased abdominal pain nausea vomiting or fevers. Patient understands. Have also apprised patient that if pain persist or becomes recurrent that she needs to establish with primary care Dr. for further evaluation. She was [facility] with primary care Dr.        Jimmie Molly, MD 02/03/13 1630

## 2013-02-19 ENCOUNTER — Other Ambulatory Visit: Payer: Self-pay | Admitting: *Deleted

## 2013-02-19 DIAGNOSIS — N63 Unspecified lump in unspecified breast: Secondary | ICD-10-CM

## 2013-02-24 ENCOUNTER — Ambulatory Visit (HOSPITAL_COMMUNITY): Payer: Self-pay

## 2013-03-05 ENCOUNTER — Other Ambulatory Visit: Payer: Self-pay | Admitting: Obstetrics and Gynecology

## 2013-03-05 ENCOUNTER — Ambulatory Visit
Admission: RE | Admit: 2013-03-05 | Discharge: 2013-03-05 | Disposition: A | Discharge status: Home / Self Care | Payer: Self-pay | Source: Ambulatory Visit | Attending: Obstetrics and Gynecology | Admitting: Obstetrics and Gynecology

## 2013-03-05 ENCOUNTER — Ambulatory Visit
Admission: RE | Admit: 2013-03-05 | Discharge: 2013-03-05 | Disposition: A | Discharge status: Home / Self Care | Payer: No Typology Code available for payment source | Source: Ambulatory Visit | Attending: Obstetrics and Gynecology | Admitting: Obstetrics and Gynecology

## 2013-03-05 DIAGNOSIS — N6322 Unspecified lump in the left breast, upper inner quadrant: Secondary | ICD-10-CM

## 2013-03-05 DIAGNOSIS — N63 Unspecified lump in unspecified breast: Secondary | ICD-10-CM

## 2013-03-10 ENCOUNTER — Other Ambulatory Visit: Payer: Self-pay

## 2013-04-03 ENCOUNTER — Telehealth (HOSPITAL_COMMUNITY): Payer: Self-pay | Admitting: *Deleted

## 2013-04-03 NOTE — Telephone Encounter (Signed)
Telephoned patient with interpreter Delorise Royals and advised patient should would 6 month follow up breast ultrasound. Patient will call [facility] program to set up appointment for follow up since lives in [place].

## 2013-04-07 ENCOUNTER — Encounter (HOSPITAL_COMMUNITY): Payer: Self-pay

## 2013-04-07 ENCOUNTER — Ambulatory Visit (HOSPITAL_COMMUNITY): Payer: Self-pay

## 2013-04-07 ENCOUNTER — Ambulatory Visit (HOSPITAL_COMMUNITY)
Admission: RE | Admit: 2013-04-07 | Discharge: 2013-04-07 | Disposition: A | Discharge status: Home / Self Care | Payer: Self-pay | Source: Ambulatory Visit | Attending: Obstetrics and Gynecology | Admitting: Obstetrics and Gynecology

## 2013-04-07 VITALS — BP 94/60 | Temp 97.8°F | Ht 60.5 in | Wt 136.2 lb

## 2013-04-07 DIAGNOSIS — Z1239 Encounter for other screening for malignant neoplasm of breast: Secondary | ICD-10-CM

## 2013-04-07 DIAGNOSIS — N6311 Unspecified lump in the right breast, upper outer quadrant: Secondary | ICD-10-CM | POA: Insufficient documentation

## 2013-04-07 NOTE — Progress Notes (Signed)
Patient referred from the free breast and cervical cancer screening at [facility] on 02/12/2013 due to right breast lump. Patient had diagnostic mammogram completed at the [facility] on 03/05/2013 needing additional imaging.  Pap Smear:    Pap smear not completed today. Last Pap smear was 02/12/2013 at the free screening at [facility] and normal. Per patient no history of an abnormal Pap smear. Pap smear result is scanned in EPIC under media.  Physical exam: Breasts Breasts symmetrical. No skin abnormalities bilateral breasts. No nipple retraction bilateral breasts. No nipple discharge bilateral breasts. No lymphadenopathy. No lumps palpated left breast. Palpated pea sized lump within the right breast at 11 o'clock 5 cm from the areola. No complaints of pain or tenderness on exam. Referred patient to the [facility] for right breast ultrasound. Appointment scheduled for April 10, 2013 at 1315.  Pelvic/Bimanual No Pap smear completed today since last Pap smear was 02/12/2013. Pap smear not indicated per BCCCP guidelines.

## 2013-04-07 NOTE — Patient Instructions (Signed)
Taught Amy [name] how to perform BSE and gave educational materials to take home. Patient did not need a Pap smear today due to last Pap smear was 02/12/2013. Let her know BCCCP will cover Pap smears every 3 years unless has a history of abnormal Pap smears. Referred patient to the [facility] for right breast ultrasound. Appointment scheduled for April 10, 2013 at 1315. Patient aware of appointment and will be there. Amy [name] verbalized understanding.  [name], [name], RN 9:12 AM

## 2013-04-10 ENCOUNTER — Ambulatory Visit
Admission: RE | Admit: 2013-04-10 | Discharge: 2013-04-10 | Disposition: A | Discharge status: Home / Self Care | Payer: No Typology Code available for payment source | Source: Ambulatory Visit | Attending: Obstetrics and Gynecology | Admitting: Obstetrics and Gynecology

## 2013-04-10 DIAGNOSIS — N6322 Unspecified lump in the left breast, upper inner quadrant: Secondary | ICD-10-CM

## 2013-11-12 ENCOUNTER — Emergency Department (HOSPITAL_COMMUNITY)
Admission: EM | Admit: 2013-11-12 | Discharge: 2013-11-12 | Disposition: A | Discharge status: Home / Self Care | Payer: No Typology Code available for payment source | Source: Home / Self Care | Attending: Family Medicine | Admitting: Family Medicine

## 2013-11-12 ENCOUNTER — Encounter (HOSPITAL_COMMUNITY): Payer: Self-pay | Admitting: Emergency Medicine

## 2013-11-12 DIAGNOSIS — R109 Unspecified abdominal pain: Secondary | ICD-10-CM

## 2013-11-12 LAB — POCT URINALYSIS DIP (DEVICE)
BILIRUBIN URINE: NEGATIVE
Glucose, UA: NEGATIVE mg/dL
Hgb urine dipstick: NEGATIVE
Ketones, ur: NEGATIVE mg/dL
Leukocytes, UA: NEGATIVE
NITRITE: NEGATIVE
PH: 5.5 (ref 5.0–8.0)
Protein, ur: NEGATIVE mg/dL
Specific Gravity, Urine: >=1.03 (ref 1.005–1.030)
Urobilinogen, UA: 0.2 mg/dL (ref 0.0–1.0)

## 2013-11-12 LAB — POCT PREGNANCY, URINE: Preg Test, Ur: POSITIVE — AB

## 2013-11-12 NOTE — ED Provider Notes (Signed)
CSN: [id number]     Arrival date & time 11/12/13  1707 History   None    Chief Complaint  Patient presents with  . 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 She has had mild RLQ pain on/ off x 1 day she denies any spotting, changes with urine or BM. She notes pain is almost colicky.    History reviewed. No pertinent past medical history. Past Surgical History  Procedure Laterality Date  . Cesarean section      one previous   Family History  Problem Relation Age of Onset  . Diabetes Father   . Diabetes Sister    History  Substance Use Topics  . Smoking status: Never Smoker   . Smokeless tobacco: Not on file  . Alcohol Use: No   OB History   Grav Para Term Preterm Abortions TAB SAB Ect Mult Living   4 3 3  1  1   3      Review of Systems  Gastrointestinal: Positive for abdominal pain.  Genitourinary: Positive for menstrual problem.  All other systems reviewed and are negative.    Allergies  Penicillins  Home Medications   Current Outpatient Rx  Name  Route  Sig  Dispense  Refill  . acetaminophen-codeine (TYLENOL #3) 300-30 MG per tablet   Oral   Take 1-2 tablets by mouth every 6 (six) hours as needed for pain.   15 tablet   0   . ondansetron (ZOFRAN) 4 MG tablet   Oral   Take 1 tablet (4 mg total) by mouth every 8 (eight) hours as needed for nausea.   10 tablet   0   . predniSONE (STERAPRED UNI-PAK) 10 MG tablet      Disp one six day pk Take as directed with food   21 tablet   0    BP 100/61  Pulse 74  Temp(Src) 97.9 F (36.6 C) (Oral)  Resp 20  SpO2 100% Physical Exam  Nursing note and vitals reviewed. Constitutional: She is oriented to person, place, and time. She appears well-developed and well-nourished.  HENT:  Head: Normocephalic and atraumatic.  Eyes:  Conjunctivae are normal.  Neck: Normal range of motion.  Cardiovascular: Normal rate, regular rhythm, normal heart sounds and intact distal pulses.   Pulmonary/Chest: Effort normal and breath sounds normal.  Abdominal: Soft. Bowel sounds are normal. She exhibits no distension and no mass. There is tenderness. There is no rebound and no guarding.  Minimal RLQ, NEG rovsings  Genitourinary:  [facility] with pregnancy +  Musculoskeletal: Normal range of motion.  Neurological: She is alert and oriented to person, place, and time.  Skin: Skin is warm and dry.  Psychiatric: She has a normal mood and affect. Judgment normal.    ED Course  Procedures (including critical care time) Labs Review Labs Reviewed  POCT PREGNANCY, URINE - Abnormal; Notable for the following:    Preg Test, Ur POSITIVE (*)    All other components within normal limits  POCT URINALYSIS DIP (DEVICE)   Imaging Review No results found.   MDM  Abdominal pain with new diagnosis Pregnancy- Advised [facility], ER if symptoms increase, bleeding or fever. Use warm compresses when cramping occurs on abdomen, push fluids, bland diet x 1 week. Low suspicion for ectopic with no spotting but advised of symtpoms to  monitor for.    Berenice Primas, PA-C 11/12/13 2129

## 2013-11-12 NOTE — Discharge Instructions (Signed)
Dolor abdominal en el embarazo  (Abdominal Pain During Pregnancy)  El dolor abdominal es frecuente durante el embarazo. Generalmente no causa ningún daño. El dolor abdominal puede tener numerosas causas. Algunas causas son más graves que otras. Ciertas causas de dolor abdominal durante el embarazo se diagnostican fácilmente. A veces, se tarda un tiempo para llegar al diagnóstico. Otras veces la causa no se conoce. El dolor abdominal puede estar relacionado con alguna alteración del embarazo, o puede deberse a una causa totalmente diferente. Por este motivo, siempre consulte a su médico cuando sienta molestias abdominales.  INSTRUCCIONES PARA EL CUIDADO EN EL HOGAR   Esté atenta al dolor para ver si hay cambios. Las siguientes indicaciones ayudarán a aliviar cualquier molestia que pueda sentir:  · No tenga relaciones sexuales y no coloque nada dentro de la vagina hasta que los síntomas hayan desaparecido completamente.  · Descanse todo lo que pueda hasta que el dolor se le haya calmado.  · Si siente náuseas, beba líquidos claros. Evite los alimentos sólidos mientras sienta malestar o tenga náuseas.  · Tome sólo medicamentos de venta libre o recetados, según las indicaciones del médico.  · Cumpla con todas las visitas de control, según le indique su médico.  SOLICITE ATENCIÓN MÉDICA DE INMEDIATO SI:  · Tiene un sangrado, pérdida de líquidos o elimina tejidos por la vagina.  · El dolor o los cólicos aumentan.  · Tiene vómitos persistentes.  · Comienza a sentir dolor al orinar u observa sangre.  · Tiene fiebre.  · Nota que los movimientos del bebé disminuyen.  · Siente intensa debilidad o se marea.  · Tiene dificultad para respirar con o sin dolor abdominal.  · Siente un dolor de cabeza intenso junto al dolor abdominal.  · Tiene una secreción vaginal anormal con dolor abdominal.  · Tiene diarrea persistente.  · El dolor abdominal sigue o empeora aún después de hacer reposo.  ASEGÚRESE DE QUE:   · Comprende estas  instrucciones.  · Controlará su afección.  · Recibirá ayuda de inmediato si no mejora o si empeora.  Document Released: 08/13/2005 Document Revised: 06/03/2013  ExitCare® Patient Information ©2014 [employer].

## 2013-11-12 NOTE — ED Notes (Signed)
Pt c/o intermittent abd pain onset yest w/dizziness LMP = 02/? -- reports poss preg Denies f/v/n/d, urinary sxs, vag d/c Alert w/no signs of acute distress.

## 2013-11-13 NOTE — ED Provider Notes (Signed)
Medical screening examination/treatment/procedure(s) were performed by resident physician or non-physician practitioner and as supervising physician I was immediately available for consultation/collaboration.   Aleeah Greeno DOUGLAS MD.   Naika Noto D Lytle Malburg, MD 11/13/13 0808 

## 2013-12-10 ENCOUNTER — Inpatient Hospital Stay (HOSPITAL_COMMUNITY)
Admission: AD | Admit: 2013-12-10 | Discharge: 2013-12-10 | Disposition: A | Discharge status: Home / Self Care | Payer: Self-pay | Source: Ambulatory Visit | Attending: Obstetrics & Gynecology | Admitting: Obstetrics & Gynecology

## 2013-12-10 ENCOUNTER — Encounter (HOSPITAL_COMMUNITY): Payer: Self-pay | Admitting: *Deleted

## 2013-12-10 ENCOUNTER — Inpatient Hospital Stay (HOSPITAL_COMMUNITY): Payer: Self-pay

## 2013-12-10 DIAGNOSIS — O209 Hemorrhage in early pregnancy, unspecified: Secondary | ICD-10-CM | POA: Insufficient documentation

## 2013-12-10 DIAGNOSIS — R109 Unspecified abdominal pain: Secondary | ICD-10-CM | POA: Insufficient documentation

## 2013-12-10 HISTORY — DX: Other specified health status: Z78.9

## 2013-12-10 LAB — CBC
HCT: 37.1 % (ref 36.0–46.0)
Hemoglobin: 13 g/dL (ref 12.0–15.0)
MCH: 30.6 pg (ref 26.0–34.0)
MCHC: 35 g/dL (ref 30.0–36.0)
MCV: 87.3 fL (ref 78.0–100.0)
PLATELETS: 223 10*3/uL (ref 150–400)
RBC: 4.25 MIL/uL (ref 3.87–5.11)
RDW: 12.7 % (ref 11.5–15.5)
WBC: 11.7 10*3/uL — ABNORMAL HIGH (ref 4.0–10.5)

## 2013-12-10 LAB — HCG, QUANTITATIVE, PREGNANCY: hCG, Beta Chain, Quant, S: 124780 m[IU]/mL — ABNORMAL HIGH (ref <5)

## 2013-12-10 LAB — WET PREP, GENITAL
Trich, Wet Prep: NONE SEEN
Yeast Wet Prep HPF POC: NONE SEEN

## 2013-12-10 NOTE — MAU Note (Signed)
abd pain started yesterday, mild cramping. Pinkish d/c started today.  [facility] + [facility]"

## 2013-12-10 NOTE — Discharge Instructions (Signed)
Embarazo, Primer trimestre  (Pregnancy, First Trimester)  El primer trimestre son los primeros tres meses en los que el beb crece dentro suyo. Es importante seguir las indicaciones del profesional que lo asiste.  CUIDADOS EN EL HOGAR   No fumar.   No beba alcohol.   Tomar slo los medicamentos que el mdico le haya indicado.   Ejercicios.   Consuma una dieta saludable. Consuma alimentos balanceados.   Puede tener relaciones sexuales si no hay problemas con el embarazo.   Ayuda para los nuseas matinales:   Coma galletitas saladas antes de levantarse por la maana.   Coma 4  5 comidas pequeas por da en vez de tres grandes.   Beba lquidos entre comidas, no durante ellas.   Concurra a la consulta con el profesional que lo asiste de acuerdo a lo que le haya indicado.   A veces necesita vitaminas extra y hierro durante el embarazo. Pregunte al mdico si las necesita.  SOLICITE AYUDA DE INMEDIATO SI:   La temperatura oral.   Percibe que tiene una prdida con olor ftido que proviene de la vagina.   Observa una hemorragia que proviene de la vagina.   Siente dolor intenso en el vientre o la espalda.   Vomita sangre. Vmitos que parecen borra de caf.   Pierde ms de 1 Kg en una semana.   Gana ms de 2 Kg en una semana.   Aumenta ms de 1 Kg en una semana Y nota los tobillos, los pies o las piernas hinchados.   Siente mareos o se desmaya.   Ha estado cerca de personas con rubola. , varicela, o la quinta enfermedad.   Presenta dolor de cabeza, diarrea, dolor al orinar o le falta la respiracin.  Document Released: 11/09/2008 Document Revised: 11/05/2011  ExitCare Patient Information 2014 [employer].

## 2013-12-10 NOTE — MAU Provider Note (Signed)
History     CSN: [id number]  Arrival date and time: 12/10/13 1829   None     Chief Complaint  Patient presents with  . Abdominal Pain   HPI This is a 33 y.o. female at 2959w3d who presents with c/o cramping and spotting Was seen a month ago in Urgent Care for RLQ pain but no pelvic was done nor workup for ectopic. Presents today with these complaints.    RN Note:   abd pain started yesterday, mild cramping. Pinkish d/c started today. [facility] + [facility]"       OB History   Grav Para Term Preterm Abortions TAB SAB Ect Mult Living   5 3 3  1  1   3       Past Medical History  Diagnosis Date  . Medical history non-contributory     Past Surgical History  Procedure Laterality Date  . Cesarean section      one previous    Family History  Problem Relation Age of Onset  . Diabetes Father   . Diabetes Sister     History  Substance Use Topics  . Smoking status: Never Smoker   . Smokeless tobacco: Not on file  . Alcohol Use: No    Allergies:  Allergies  Allergen Reactions  . Penicillins Rash    No prescriptions prior to admission    Review of Systems  Constitutional: Negative for fever, chills and malaise/fatigue.  Gastrointestinal: Positive for abdominal pain (RLQ mostly ). Negative for nausea, vomiting, diarrhea and constipation.  Genitourinary: Negative for dysuria.       Light spotting   Neurological: Negative for dizziness and headaches.   Physical Exam   Blood pressure 103/62, pulse 83, temperature 98.4 F (36.9 C), temperature source Oral, resp. rate 16, height 5' (1.524 m), weight 65.772 kg (145 lb), last menstrual period 10/12/2013.  Physical Exam  Constitutional: She is oriented to person, place, and time. She appears well-developed and well-nourished. No distress.  HENT:  Head: Normocephalic.  Cardiovascular: Normal rate.   Respiratory: Effort normal.  GI: Soft. She exhibits no distension. There is tenderness (RLQ  mod tender). There is no rebound and no guarding.  Genitourinary: Uterus normal. Vaginal discharge (light brown discharge) found.  Cervix closed and long Uterus difficult to palpate.   Musculoskeletal: Normal range of motion.  Neurological: She is alert and oriented to person, place, and time.  Skin: Skin is warm and dry.  Psychiatric: She has a normal mood and affect.    MAU Course  Procedures  MDM Results for orders placed during the hospital encounter of 12/10/13 (from the past 24 hour(s))  CBC     Status: Abnormal   Collection Time    12/10/13  6:37 PM      Result Value Ref Range   WBC 11.7 (*) 4.0 - 10.5 K/uL   RBC 4.25  3.87 - 5.11 MIL/uL   Hemoglobin 13.0  12.0 - 15.0 g/dL   HCT 40.937.1  81.136.0 - 91.446.0 %   MCV 87.3  78.0 - 100.0 fL   MCH 30.6  26.0 - 34.0 pg   MCHC 35.0  30.0 - 36.0 g/dL   RDW 78.212.7  95.611.5 - 21.315.5 %   Platelets 223  150 - 400 K/uL  HCG, QUANTITATIVE, PREGNANCY     Status: Abnormal   Collection Time    12/10/13  6:37 PM      Result Value Ref Range  hCG, Beta Nyra Jabs, [place] 431540 (*) <5 mIU/mL  WET PREP, GENITAL     Status: Abnormal   Collection Time    12/10/13  8:30 PM      Result Value Ref Range   Yeast Wet Prep HPF POC NONE SEEN  NONE SEEN   Trich, Wet Prep NONE SEEN  NONE SEEN   Clue Cells Wet Prep HPF POC FEW (*) NONE SEEN   WBC, Wet Prep HPF POC FEW (*) NONE SEEN   [place] ordered  US Ob Comp Less 14 Wks  12/10/2013   CLINICAL DATA:  Early pregnancy with vaginal bleeding. Last menstrual period is approximately 10/12/2013.  EXAM: OBSTETRIC <14 WK [place] AND TRANSVAGINAL OB US  TECHNIQUE: Both transabdominal and transvaginal ultrasound examinations were performed for complete evaluation of the gestation as well as the maternal uterus, adnexal regions, and pelvic cul-de-sac. Transvaginal technique was performed to assess early pregnancy.  COMPARISON:  US OB TRANSVAGINAL dated 12/10/2013; US OB TRANSVAGINAL dated 05/09/2010  FINDINGS: Intrauterine gestational  sac: Normal intrauterine gestational sac is visualized.  Yolk sac:  Normal yolk sac visualized.  Embryo:  Single embryo visualize.  Cardiac Activity: Regular cardiac activity detected.  Heart Rate:  165 bpm  MSD:    mm    w     d  CRL:   22  mm   9 w 0 d                  [place] EDC: 07/15/2014  No subchorionic hemorrhage is identified. No free fluid is identified in the pelvis.  Maternal uterus/adnexae: Cyst of the right ovary measures approximately 5 cm in contains some mild internal echogenicity.  IMPRESSION: Single viable intrauterine gestation with estimated gestational age of [redacted] weeks and 0 days by ultrasound. Estimated EDC is 07/15/2014. Mildly complex 5 cm right adnexal cyst identified.   Electronically Signed   By: Irish Lack M.D.   On: 12/10/2013 22:04   US Ob Transvaginal  12/10/2013   CLINICAL DATA:  Early pregnancy with vaginal bleeding. Last menstrual period is approximately 10/12/2013.  EXAM: OBSTETRIC <14 WK [place] AND TRANSVAGINAL OB US  TECHNIQUE: Both transabdominal and transvaginal ultrasound examinations were performed for complete evaluation of the gestation as well as the maternal uterus, adnexal regions, and pelvic cul-de-sac. Transvaginal technique was performed to assess early pregnancy.  COMPARISON:  US OB TRANSVAGINAL dated 12/10/2013; US OB TRANSVAGINAL dated 05/09/2010  FINDINGS: Intrauterine gestational sac: Normal intrauterine gestational sac is visualized.  Yolk sac:  Normal yolk sac visualized.  Embryo:  Single embryo visualize.  Cardiac Activity: Regular cardiac activity detected.  Heart Rate:  165 bpm  MSD:    mm    w     d  CRL:   22  mm   9 w 0 d                  [place] EDC: 07/15/2014  No subchorionic hemorrhage is identified. No free fluid is identified in the pelvis.  Maternal uterus/adnexae: Cyst of the right ovary measures approximately 5 cm in contains some mild internal echogenicity.  IMPRESSION: Single viable intrauterine gestation with estimated gestational age of [redacted] weeks and 0  days by ultrasound. Estimated EDC is 07/15/2014. Mildly complex 5 cm right adnexal cyst identified.   Electronically Signed   By: Irish Lack M.D.   On: 12/10/2013 22:04     Assessment and Plan  Report to oncoming CNM  1. First trimester bleeding  Bleeding precautions First trimester precautions [facility]  Follow-up Information   Schedule an appointment as soon as possible for a [facility] with [facility] [place].   Contact information:   [address] [place] [address] [phone]       Tawnya CrookHeather Donovan Jastin Fore 12/10/2013, 10:09 PM

## 2013-12-11 ENCOUNTER — Encounter (HOSPITAL_COMMUNITY): Payer: Self-pay

## 2013-12-11 LAB — GC/CHLAMYDIA PROBE AMP
CT PROBE, AMP APTIMA: NEGATIVE
GC Probe RNA: NEGATIVE

## 2013-12-14 NOTE — MAU Provider Note (Signed)
Attestation of Attending Supervision of Advanced Practitioner (PA/CNM/NP): Evaluation and management procedures were performed by the Advanced Practitioner under my supervision and collaboration.  I have reviewed the Advanced Practitioner's note and chart, and I agree with the management and plan.  Alonah Lineback, MD, FACOG Attending Obstetrician & Gynecologist Faculty Practice, [facility]  

## 2013-12-25 ENCOUNTER — Inpatient Hospital Stay (HOSPITAL_COMMUNITY)
Admission: AD | Admit: 2013-12-25 | Discharge: 2013-12-25 | Disposition: A | Discharge status: Home / Self Care | Payer: Self-pay | Source: Ambulatory Visit | Attending: Obstetrics & Gynecology | Admitting: Obstetrics & Gynecology

## 2013-12-25 ENCOUNTER — Encounter (HOSPITAL_COMMUNITY): Payer: Self-pay | Admitting: *Deleted

## 2013-12-25 DIAGNOSIS — O9989 Other specified diseases and conditions complicating pregnancy, childbirth and the puerperium: Secondary | ICD-10-CM

## 2013-12-25 DIAGNOSIS — O210 Mild hyperemesis gravidarum: Secondary | ICD-10-CM | POA: Insufficient documentation

## 2013-12-25 DIAGNOSIS — O99891 Other specified diseases and conditions complicating pregnancy: Secondary | ICD-10-CM

## 2013-12-25 DIAGNOSIS — O368190 Decreased fetal movements, unspecified trimester, not applicable or unspecified: Secondary | ICD-10-CM | POA: Insufficient documentation

## 2013-12-25 DIAGNOSIS — R110 Nausea: Secondary | ICD-10-CM

## 2013-12-25 LAB — URINALYSIS, ROUTINE W REFLEX MICROSCOPIC
Bilirubin Urine: NEGATIVE
GLUCOSE, UA: NEGATIVE mg/dL
HGB URINE DIPSTICK: NEGATIVE
Ketones, ur: NEGATIVE mg/dL
Nitrite: NEGATIVE
PH: 6 (ref 5.0–8.0)
Protein, ur: NEGATIVE mg/dL
Specific Gravity, Urine: 1.025 (ref 1.005–1.030)
Urobilinogen, UA: 0.2 mg/dL (ref 0.0–1.0)

## 2013-12-25 LAB — URINE MICROSCOPIC-ADD ON

## 2013-12-25 NOTE — MAU Note (Signed)
WITH INTERPRETER - ANA -  SAYS SHE WAS HERE ON 4-16-  POSTIVE PREG-  ALSO HAD VAG D/C.    PLANS PNC-  CLINIC DOWNSTAIRS.   WHEN SHE CHANGES POSITION , SHE FEELS A CRAMP.  ALSO FEELS SOMETHING   THERE  IN UTERUS  .    ALSO FEELS LIKE HER BREAST ARE FULL OF MILK-  AROUND NIPPLE- WHITE  AND DRY.       NO VAG BLEEDING.     DENIES LOWER ABD CRAMPS.    LAST SEX- 4-10.     APPOINTMENT  DOWNSTAIRS-  5-4.

## 2013-12-25 NOTE — MAU Provider Note (Signed)
History     CSN: [id number]  Arrival date and time: 12/25/13 [id number]   First Provider Initiated Contact with Patient 12/25/13 0239      No chief complaint on file.  HPI  Ms. Amy [name] is a 33 y.o. female [phone] at 4773w4d who presents with decreased fetal movement. She has not felt the baby move and does not feel her breast feel like they did when she found out she was pregnant. When she told the father of the baby she was pregnant he left and now she is worried. She denies anyone hurting her, patient feels safe in her environment. She is scheduled Monday in the clinic for her first prenatal visit.    OB History   Grav Para Term Preterm Abortions TAB SAB Ect Mult Living   5 3 3  1  1   3       Past Medical History  Diagnosis Date  . Medical history non-contributory     Past Surgical History  Procedure Laterality Date  . Cesarean section      one previous    Family History  Problem Relation Age of Onset  . Diabetes Father   . Diabetes Sister     History  Substance Use Topics  . Smoking status: Never Smoker   . Smokeless tobacco: Not on file  . Alcohol Use: No    Allergies:  Allergies  Allergen Reactions  . Penicillins Rash    No prescriptions prior to admission   Results for orders placed during the hospital encounter of 12/25/13 (from the past 48 hour(s))  URINALYSIS, ROUTINE W REFLEX MICROSCOPIC     Status: Abnormal   Collection Time    12/25/13  2:04 AM      Result Value Ref Range   Color, Urine YELLOW  YELLOW   APPearance CLEAR  CLEAR   Specific Gravity, Urine 1.025  1.005 - 1.030   pH 6.0  5.0 - 8.0   Glucose, UA NEGATIVE  NEGATIVE mg/dL   Hgb urine dipstick NEGATIVE  NEGATIVE   Bilirubin Urine NEGATIVE  NEGATIVE   Ketones, ur NEGATIVE  NEGATIVE mg/dL   Protein, ur NEGATIVE  NEGATIVE mg/dL   Urobilinogen, UA 0.2  0.0 - 1.0 mg/dL   Nitrite NEGATIVE  NEGATIVE   Leukocytes, UA TRACE (*) NEGATIVE  URINE MICROSCOPIC-ADD ON     Status:  Abnormal   Collection Time    12/25/13  2:04 AM      Result Value Ref Range   Squamous Epithelial / LPF FEW (*) RARE   WBC, UA 3-6  <3 WBC/hpf   RBC / HPF 0-2  <3 RBC/hpf   Bacteria, UA FEW (*) RARE    Review of Systems  Constitutional: Negative for fever and chills.  Gastrointestinal: Positive for nausea. Negative for vomiting, abdominal pain, diarrhea, constipation and blood in stool.  Genitourinary: Negative for dysuria, urgency, frequency and hematuria.       No vaginal discharge. No vaginal bleeding. No dysuria.    Physical Exam   Blood pressure 96/62, pulse 90, temperature 98.8 F (37.1 C), temperature source Oral, resp. rate 20, height 4\' 11"  (1.499 m), weight 62.313 kg (137 lb 6 oz), last menstrual period 10/12/2013.  Physical Exam  Constitutional: She is oriented to person, place, and time. She appears well-developed and well-nourished. No distress.  HENT:  Head: Normocephalic.  Eyes: Pupils are equal, round, and reactive to light.  Neck: Neck supple.  Respiratory: Effort normal.  GI: Soft. She exhibits  no distension. There is no tenderness. There is no rebound.  Musculoskeletal: Normal range of motion.  Neurological: She is alert and oriented to person, place, and time.  Skin: Skin is warm. She is not diaphoretic.  Psychiatric: Her behavior is normal.    MAU Course  Procedures None  MDM +fht. 163 via fetal doppler  Safe medication list given to Patient. Unisom and B6 recommended for nausea.   Assessment and Plan   A:  Decreased fetal movement at 7043w4d; patient is concerned Nausea in pregnancy   P: Discharge home in stable condition Reassurance offered at length Keep appt in the clinic. Return with any concerns   Iona HansenJennifer Irene Rasch, NP  12/25/2013, 8:16 AM

## 2013-12-27 NOTE — MAU Provider Note (Signed)
Attestation of Attending Supervision of Advanced Practitioner (CNM/NP): Evaluation and management procedures were performed by the Advanced Practitioner under my supervision and collaboration. I have reviewed the Advanced Practitioner's note and chart, and I agree with the management and plan.  Lesly DukesKelly H Aryaan Persichetti 7:46 PM

## 2013-12-28 ENCOUNTER — Encounter: Payer: Self-pay | Admitting: Family

## 2013-12-28 ENCOUNTER — Ambulatory Visit (INDEPENDENT_AMBULATORY_CARE_PROVIDER_SITE_OTHER): Payer: Self-pay | Admitting: Family

## 2013-12-28 VITALS — BP 104/68 | HR 94 | Wt 138.1 lb

## 2013-12-28 DIAGNOSIS — B360 Pityriasis versicolor: Secondary | ICD-10-CM

## 2013-12-28 DIAGNOSIS — O09291 Supervision of pregnancy with other poor reproductive or obstetric history, first trimester: Secondary | ICD-10-CM

## 2013-12-28 DIAGNOSIS — B9689 Other specified bacterial agents as the cause of diseases classified elsewhere: Secondary | ICD-10-CM

## 2013-12-28 DIAGNOSIS — O34219 Maternal care for unspecified type scar from previous cesarean delivery: Secondary | ICD-10-CM

## 2013-12-28 DIAGNOSIS — Z3490 Encounter for supervision of normal pregnancy, unspecified, unspecified trimester: Secondary | ICD-10-CM | POA: Insufficient documentation

## 2013-12-28 DIAGNOSIS — N760 Acute vaginitis: Secondary | ICD-10-CM

## 2013-12-28 DIAGNOSIS — Z3481 Encounter for supervision of other normal pregnancy, first trimester: Secondary | ICD-10-CM

## 2013-12-28 DIAGNOSIS — Z9889 Other specified postprocedural states: Secondary | ICD-10-CM

## 2013-12-28 DIAGNOSIS — O3430 Maternal care for cervical incompetence, unspecified trimester: Secondary | ICD-10-CM | POA: Insufficient documentation

## 2013-12-28 DIAGNOSIS — Z3480 Encounter for supervision of other normal pregnancy, unspecified trimester: Secondary | ICD-10-CM

## 2013-12-28 DIAGNOSIS — A499 Bacterial infection, unspecified: Secondary | ICD-10-CM

## 2013-12-28 DIAGNOSIS — O09299 Supervision of pregnancy with other poor reproductive or obstetric history, unspecified trimester: Secondary | ICD-10-CM

## 2013-12-28 LAB — POCT URINALYSIS DIP (DEVICE)
BILIRUBIN URINE: NEGATIVE
GLUCOSE, UA: NEGATIVE mg/dL
HGB URINE DIPSTICK: NEGATIVE
Ketones, ur: NEGATIVE mg/dL
Leukocytes, UA: NEGATIVE
Nitrite: NEGATIVE
Protein, ur: NEGATIVE mg/dL
Specific Gravity, Urine: 1.025 (ref 1.005–1.030)
UROBILINOGEN UA: 0.2 mg/dL (ref 0.0–1.0)
pH: 6 (ref 5.0–8.0)

## 2013-12-28 MED ORDER — METOCLOPRAMIDE HCL 10 MG PO TABS: 10.0000 mg | ORAL_TABLET | Freq: Three times a day (TID) | ORAL | Status: DC

## 2013-12-28 MED ORDER — KETOCONAZOLE 2 % EX CREA: 1.0000 "application " | TOPICAL_CREAM | Freq: Every day | CUTANEOUS | Status: DC

## 2013-12-28 MED ORDER — METRONIDAZOLE 500 MG PO TABS: 500.0000 mg | ORAL_TABLET | Freq: Two times a day (BID) | ORAL | Status: DC

## 2013-12-28 NOTE — Progress Notes (Signed)
MFM appointment scheduled 01/08/14 at 2 pm.

## 2013-12-28 NOTE — Progress Notes (Signed)
Cerclage next 1-2 wks, flagyl, ketoconozole  Subjective:    Amy [name] is a [id number] 388w4d by 9 wk ultrasound being seen today for her first obstetrical visit.  Her obstetrical history is significant for cervical incompetence and history of prior csection with two successful VBACs.. Pt received cerclage at 18.2 wks with last pregnancy in 2011 and delivered at term.  Pt desires cerclage this pregnancy.  Patient does intend to breast feed. Pregnancy history fully reviewed.  Patient reports nausea, no bleeding and no cramping.  Also reports rash on lower extremities that itches.  Denies fever, body aches, or chills.    Filed Vitals:   12/28/13 0922  BP: 104/68  Pulse: 94  Weight: 138 lb 1.6 oz (62.642 kg)    HISTORY: OB History  Gravida Para Term Preterm AB SAB TAB Ectopic Multiple Living  5 3 3  0 1 1    3     # Outcome Date GA Lbr Len/2nd Weight Sex Delivery Anes PTL Lv  5 CUR           4 TRM 07/12/10 1736w0d  6 lb 11 oz (3.033 kg) M VBAC   Y     Comments: Cerclage placed at 18.2 wks by Dr. Debroah LoopArnold  3 SAB 12/20/07 781w0d            Comments: Cervical incompetence  2 TRM 04/14/04 7045w2d  6 lb 12 oz (3.062 kg) M VBAC EPI  Y  1 TRM 09/30/01 6138w0d  9 lb 2 oz (4.139 kg) M LTCS Spinal  Y     Comments: FTP, chorio, dilated to ant lip     Past Medical History  Diagnosis Date  . Medical history non-contributory    Past Surgical History  Procedure Laterality Date  . Cesarean section      one previous   Family History  Problem Relation Age of Onset  . Diabetes Father   . Diabetes Sister      Exam   Filed Vitals:   12/28/13 0922  BP: 104/68  Pulse: 94   Exam   BP 104/68  Pulse 94  Wt 138 lb 1.6 oz (62.642 kg)  LMP 10/12/2013 Uterine Size: size equals dates  Pelvic Exam:    Perineum: No Hemorrhoids, Normal Perineum   Vulva: normal   Vagina:  normal mucosa, normal discharge, no palpable nodules   pH: Not done   Cervix: Slight  bleeding following Pap, no  cervical motion tenderness and no lesions.  Cervix palpates closed and long.     Adnexa: normal adnexa and no mass, fullness, tenderness   Bony Pelvis: Adequate  System: Breast:  No nipple retraction or dimpling, No nipple discharge or bleeding, No axillary or supraclavicular adenopathy, Normal to palpation without dominant masses   Skin: normal coloration and turgor, no rashes    Neurologic: negative   Extremities: normal strength, tone, and muscle mass   HEENT neck supple with midline trachea and thyroid without masses   Mouth/Teeth mucous membranes moist, pharynx normal without lesions   Neck supple and no masses   Cardiovascular: regular rate and rhythm, no murmurs or gallops   Respiratory:  appears well, vitals normal, no respiratory distress, acyanotic, normal RR, neck free of mass or lymphadenopathy, chest clear, no wheezing, crepitations, rhonchi, normal symmetric air entry   Abdomen: soft, non-tender; bowel sounds normal; no masses,  no organomegaly   Urinary: urethral meatus normal      Assessment:    Pregnancy:   33 yo G5P3013 at  3087w4d wks IUP  Patient Active Problem List   Diagnosis Date Noted  . Supervision of normal pregnancy 12/28/2013  . Previous cesarean delivery, antepartum condition or complication 12/28/2013  . Cervical incompetence affecting management of pregnancy, antepartum 12/28/2013  Tinea Versicolor  Nausea in Pregnancy    Plan:    Consulted with Dr. Nonah MattesAnyanawu > 17p not needed, pt to be scheduled for cerclage by Dr. Debroah LoopArnold.  Dr. Debroah LoopARnold routes message to [place] to schedule.   Initial labs drawn.  Pap smear collected. Prenatal vitamins. RX Reglan RX Nizoral  Problem list reviewed and updated. Genetic Screening discussed:   requested.  First screen ordered.    Follow up in two weeks.  Eino FarberWalidah Paul HalfN Muhammad

## 2013-12-28 NOTE — Progress Notes (Signed)
Nutrition note: 1st visit consult Pt has gained 0.1# @ 4169w4d, which is < expected. Pt reports only eating 1-2 x/d currently due to severe N/V (received a prescription today). Pt is not taking a PNV due to N/V. Pt reports no heartburn. Pt received verbal & written education in Spanish via interpreter about general nutrition during pregnancy. Discussed tips to decrease N/V and encouraged PNV or 2 chewable multivitamins when feeling better. Discussed wt gain goals of 15-25# or 0.6#/wk in 2nd & 3rd trimester. Pt agrees to start PNV when feeling better. Pt does not have WIC but plans to apply. Pt plans to BF. F/u in 4-6 wks Blondell RevealLaura Panayiota Larkin, MS, RD, LDN, [facility]

## 2013-12-29 LAB — OBSTETRIC PANEL
ANTIBODY SCREEN: NEGATIVE
BASOS ABS: 0 10*3/uL (ref 0.0–0.1)
Basophils Relative: 0 % (ref 0–1)
Eosinophils Absolute: 0.3 10*3/uL (ref 0.0–0.7)
Eosinophils Relative: 3 % (ref 0–5)
HCT: 36.7 % (ref 36.0–46.0)
HEP B S AG: NEGATIVE
Hemoglobin: 13 g/dL (ref 12.0–15.0)
LYMPHS PCT: 15 % (ref 12–46)
Lymphs Abs: 1.5 10*3/uL (ref 0.7–4.0)
MCH: 30.5 pg (ref 26.0–34.0)
MCHC: 35.4 g/dL (ref 30.0–36.0)
MCV: 86.2 fL (ref 78.0–100.0)
Monocytes Absolute: 0.5 10*3/uL (ref 0.1–1.0)
Monocytes Relative: 5 % (ref 3–12)
NEUTROS ABS: 7.7 10*3/uL (ref 1.7–7.7)
NEUTROS PCT: 77 % (ref 43–77)
Platelets: 282 10*3/uL (ref 150–400)
RBC: 4.26 MIL/uL (ref 3.87–5.11)
RDW: 12.9 % (ref 11.5–15.5)
Rh Type: POSITIVE
Rubella: 29.3 Index — ABNORMAL HIGH (ref <0.90)
WBC: 10 10*3/uL (ref 4.0–10.5)

## 2013-12-29 LAB — HIV ANTIBODY (ROUTINE TESTING W REFLEX): HIV 1&2 Ab, 4th Generation: NONREACTIVE

## 2013-12-30 ENCOUNTER — Encounter: Payer: Self-pay | Admitting: Family

## 2013-12-30 DIAGNOSIS — B360 Pityriasis versicolor: Secondary | ICD-10-CM | POA: Insufficient documentation

## 2013-12-30 DIAGNOSIS — B9689 Other specified bacterial agents as the cause of diseases classified elsewhere: Secondary | ICD-10-CM | POA: Insufficient documentation

## 2013-12-30 DIAGNOSIS — N760 Acute vaginitis: Secondary | ICD-10-CM

## 2013-12-30 LAB — CULTURE, OB URINE: Colony Count: 3000

## 2013-12-30 LAB — PRESCRIPTION MONITORING PROFILE (19 PANEL)
Amphetamine/Meth: NEGATIVE ng/mL
BARBITURATE SCREEN, URINE: NEGATIVE ng/mL
Benzodiazepine Screen, Urine: NEGATIVE ng/mL
Buprenorphine, Urine: NEGATIVE ng/mL
CANNABINOID SCRN UR: NEGATIVE ng/mL
CREATININE, URINE: 171.53 mg/dL (ref >20.0)
Carisoprodol, Urine: NEGATIVE ng/mL
Cocaine Metabolites: NEGATIVE ng/mL
ECSTASY: NEGATIVE ng/mL
FENTANYL URINE: NEGATIVE ng/mL
Meperidine, Ur: NEGATIVE ng/mL
Methadone Screen, Urine: NEGATIVE ng/mL
Methaqualone: NEGATIVE ng/mL
Nitrites, Initial: NEGATIVE ug/mL
OPIATE SCREEN, URINE: NEGATIVE ng/mL
OXYCODONE SCRN UR: NEGATIVE ng/mL
PHENCYCLIDINE, UR: NEGATIVE ng/mL
Propoxyphene: NEGATIVE ng/mL
Tapentadol, urine: NEGATIVE ng/mL
Tramadol Scrn, Ur: NEGATIVE ng/mL
Zolpidem, Urine: NEGATIVE ng/mL
pH, Initial: 6.5 pH (ref 4.5–8.9)

## 2013-12-30 LAB — HEMOGLOBINOPATHY EVALUATION
HEMOGLOBIN OTHER: 0 %
HGB F QUANT: 0 % (ref 0.0–2.0)
HGB S QUANTITAION: 0 %
Hgb A2 Quant: 2.6 % (ref 2.2–3.2)
Hgb A: 97.4 % (ref 96.8–97.8)

## 2014-01-01 ENCOUNTER — Encounter: Payer: Self-pay | Admitting: Family

## 2014-01-01 DIAGNOSIS — IMO0002 Reserved for concepts with insufficient information to code with codable children: Secondary | ICD-10-CM | POA: Insufficient documentation

## 2014-01-07 ENCOUNTER — Other Ambulatory Visit: Payer: Self-pay | Admitting: Obstetrics & Gynecology

## 2014-01-08 ENCOUNTER — Other Ambulatory Visit: Payer: Self-pay

## 2014-01-08 ENCOUNTER — Ambulatory Visit (HOSPITAL_COMMUNITY)
Admission: RE | Admit: 2014-01-08 | Discharge: 2014-01-08 | Disposition: A | Discharge status: Home / Self Care | Payer: Self-pay | Source: Ambulatory Visit | Attending: Family | Admitting: Family

## 2014-01-08 VITALS — BP 97/53 | HR 83 | Wt 139.0 lb

## 2014-01-08 DIAGNOSIS — Z3689 Encounter for other specified antenatal screening: Secondary | ICD-10-CM | POA: Insufficient documentation

## 2014-01-08 DIAGNOSIS — O34219 Maternal care for unspecified type scar from previous cesarean delivery: Secondary | ICD-10-CM

## 2014-01-08 DIAGNOSIS — Z3490 Encounter for supervision of normal pregnancy, unspecified, unspecified trimester: Secondary | ICD-10-CM

## 2014-01-08 DIAGNOSIS — O3430 Maternal care for cervical incompetence, unspecified trimester: Secondary | ICD-10-CM

## 2014-01-08 DIAGNOSIS — B9689 Other specified bacterial agents as the cause of diseases classified elsewhere: Secondary | ICD-10-CM

## 2014-01-08 DIAGNOSIS — N760 Acute vaginitis: Secondary | ICD-10-CM

## 2014-01-11 ENCOUNTER — Ambulatory Visit (INDEPENDENT_AMBULATORY_CARE_PROVIDER_SITE_OTHER): Payer: Self-pay | Admitting: Family

## 2014-01-11 VITALS — BP 90/64 | HR 81 | Temp 97.7°F | Wt 138.5 lb

## 2014-01-11 DIAGNOSIS — Z3490 Encounter for supervision of normal pregnancy, unspecified, unspecified trimester: Secondary | ICD-10-CM

## 2014-01-11 DIAGNOSIS — B360 Pityriasis versicolor: Secondary | ICD-10-CM

## 2014-01-11 DIAGNOSIS — O3430 Maternal care for cervical incompetence, unspecified trimester: Secondary | ICD-10-CM

## 2014-01-11 DIAGNOSIS — Z3480 Encounter for supervision of other normal pregnancy, unspecified trimester: Secondary | ICD-10-CM

## 2014-01-11 LAB — POCT URINALYSIS DIP (DEVICE)
BILIRUBIN URINE: NEGATIVE
Glucose, UA: NEGATIVE mg/dL
Hgb urine dipstick: NEGATIVE
KETONES UR: NEGATIVE mg/dL
Nitrite: NEGATIVE
PH: 5.5 (ref 5.0–8.0)
Protein, ur: NEGATIVE mg/dL
Specific Gravity, Urine: >=1.03 (ref 1.005–1.030)
Urobilinogen, UA: 0.2 mg/dL (ref 0.0–1.0)

## 2014-01-11 MED ORDER — KETOCONAZOLE 2 % EX CREA: 1.0000 "application " | TOPICAL_CREAM | Freq: Every day | CUTANEOUS | Status: DC

## 2014-01-11 NOTE — Patient Instructions (Signed)
Cerclage Wednesday, May 20th, 11:30 am.

## 2014-01-11 NOTE — Progress Notes (Signed)
Reviewed fetal nuchal results.  Cerclage scheduled for 01/13/14 at 1 pm.  Explained to patient to arrive at 11:30.  No bleeding or cramping at this time.

## 2014-01-13 ENCOUNTER — Encounter (HOSPITAL_COMMUNITY)
Admission: RE | Disposition: A | Discharge status: Home / Self Care | Payer: Self-pay | Source: Ambulatory Visit | Attending: Obstetrics & Gynecology

## 2014-01-13 ENCOUNTER — Ambulatory Visit (HOSPITAL_COMMUNITY)
Admission: RE | Admit: 2014-01-13 | Discharge: 2014-01-13 | Disposition: A | Discharge status: Home / Self Care | Payer: Self-pay | Source: Ambulatory Visit | Attending: Obstetrics & Gynecology | Admitting: Obstetrics & Gynecology

## 2014-01-13 ENCOUNTER — Encounter (HOSPITAL_COMMUNITY): Payer: Self-pay | Admitting: Certified Registered"

## 2014-01-13 ENCOUNTER — Ambulatory Visit (HOSPITAL_COMMUNITY): Payer: Self-pay | Admitting: Certified Registered"

## 2014-01-13 DIAGNOSIS — O3430 Maternal care for cervical incompetence, unspecified trimester: Secondary | ICD-10-CM

## 2014-01-13 HISTORY — PX: CERVICAL CERCLAGE: SHX1329

## 2014-01-13 LAB — CBC
HCT: 35.5 % — ABNORMAL LOW (ref 36.0–46.0)
Hemoglobin: 12.4 g/dL (ref 12.0–15.0)
MCH: 30.5 pg (ref 26.0–34.0)
MCHC: 34.9 g/dL (ref 30.0–36.0)
MCV: 87.2 fL (ref 78.0–100.0)
Platelets: 180 10*3/uL (ref 150–400)
RBC: 4.07 MIL/uL (ref 3.87–5.11)
RDW: 13.5 % (ref 11.5–15.5)
WBC: 10.6 10*3/uL — AB (ref 4.0–10.5)

## 2014-01-13 SURGERY — CERCLAGE, CERVIX, VAGINAL APPROACH
Anesthesia: Spinal | Site: Cervix

## 2014-01-13 MED ORDER — GLYCOPYRROLATE 0.2 MG/ML IJ SOLN
INTRAMUSCULAR | Status: AC
  Filled 2014-01-13: qty 2

## 2014-01-13 MED ORDER — PHENYLEPHRINE HCL 10 MG/ML IJ SOLN
INTRAMUSCULAR | Status: DC | PRN
  Administered 2014-01-13: 40 ug via INTRAVENOUS
  Administered 2014-01-13 (×2): 80 ug via INTRAVENOUS

## 2014-01-13 MED ORDER — FENTANYL CITRATE 0.05 MG/ML IJ SOLN
INTRAMUSCULAR | Status: DC | PRN
  Administered 2014-01-13 (×2): 50 ug via INTRAVENOUS

## 2014-01-13 MED ORDER — LIDOCAINE IN DEXTROSE 5-7.5 % IV SOLN
INTRAVENOUS | Status: AC
  Filled 2014-01-13: qty 4

## 2014-01-13 MED ORDER — ONDANSETRON HCL 4 MG/2ML IJ SOLN
INTRAMUSCULAR | Status: DC | PRN
  Administered 2014-01-13: 4 mg via INTRAVENOUS

## 2014-01-13 MED ORDER — FENTANYL CITRATE 0.05 MG/ML IJ SOLN: 25.0000 ug | INTRAMUSCULAR | Status: DC | PRN

## 2014-01-13 MED ORDER — MEPERIDINE HCL 25 MG/ML IJ SOLN: 6.2500 mg | INTRAMUSCULAR | Status: DC | PRN

## 2014-01-13 MED ORDER — PROPOFOL 10 MG/ML IV EMUL
INTRAVENOUS | Status: AC
  Filled 2014-01-13: qty 20

## 2014-01-13 MED ORDER — KETOROLAC TROMETHAMINE 30 MG/ML IJ SOLN: 15.0000 mg | Freq: Once | INTRAMUSCULAR | Status: DC | PRN

## 2014-01-13 MED ORDER — ACETAMINOPHEN 325 MG PO TABS: 325.0000 mg | ORAL_TABLET | ORAL | Status: DC | PRN

## 2014-01-13 MED ORDER — PHENYLEPHRINE 40 MCG/ML (10ML) SYRINGE FOR IV PUSH (FOR BLOOD PRESSURE SUPPORT)
PREFILLED_SYRINGE | INTRAVENOUS | Status: AC
  Filled 2014-01-13: qty 5

## 2014-01-13 MED ORDER — ONDANSETRON HCL 4 MG/2ML IJ SOLN
INTRAMUSCULAR | Status: AC
  Filled 2014-01-13: qty 2

## 2014-01-13 MED ORDER — METOCLOPRAMIDE HCL 5 MG/ML IJ SOLN: 10.0000 mg | Freq: Once | INTRAMUSCULAR | Status: DC | PRN

## 2014-01-13 MED ORDER — LIDOCAINE HCL (CARDIAC) 20 MG/ML IV SOLN
INTRAVENOUS | Status: AC
  Filled 2014-01-13: qty 5

## 2014-01-13 MED ORDER — OXYCODONE-ACETAMINOPHEN 5-325 MG PO TABS: 1.0000 | ORAL_TABLET | Freq: Four times a day (QID) | ORAL | Status: DC | PRN

## 2014-01-13 MED ORDER — ACETAMINOPHEN 160 MG/5ML PO SOLN: 325.0000 mg | ORAL | Status: DC | PRN

## 2014-01-13 MED ORDER — LACTATED RINGERS IV SOLN
INTRAVENOUS | Status: DC
  Administered 2014-01-13: 14:00:00 via INTRAVENOUS

## 2014-01-13 MED ORDER — LACTATED RINGERS IV SOLN: INTRAVENOUS | Status: DC

## 2014-01-13 MED ORDER — LIDOCAINE IN DEXTROSE 5-7.5 % IV SOLN
INTRAVENOUS | Status: DC | PRN
  Administered 2014-01-13: 1 mL via INTRATHECAL

## 2014-01-13 MED ORDER — FENTANYL CITRATE 0.05 MG/ML IJ SOLN
INTRAMUSCULAR | Status: AC
  Filled 2014-01-13: qty 2

## 2014-01-13 MED ORDER — ESTRADIOL 0.1 MG/GM VA CREA
TOPICAL_CREAM | VAGINAL | Status: AC
  Filled 2014-01-13: qty 42.5

## 2014-01-13 SURGICAL SUPPLY — 19 items
CANISTER SUCT 3000ML (MISCELLANEOUS) ×3 IMPLANT
CLOTH BEACON ORANGE TIMEOUT ST (SAFETY) ×3 IMPLANT
COUNTER NEEDLE 1200 MAGNETIC (NEEDLE) ×3 IMPLANT
GAUZE PACKING 2X5 YD STRL (GAUZE/BANDAGES/DRESSINGS) ×2 IMPLANT
GLOVE BIO SURGEON STRL SZ 6.5 (GLOVE) ×2 IMPLANT
GLOVE BIO SURGEONS STRL SZ 6.5 (GLOVE) ×1
GLOVE BIOGEL PI IND STRL 7.0 (GLOVE) ×1 IMPLANT
GLOVE BIOGEL PI INDICATOR 7.0 (GLOVE) ×2
GOWN STRL REUS W/TWL LRG LVL3 (GOWN DISPOSABLE) ×6 IMPLANT
NS IRRIG 1000ML POUR BTL (IV SOLUTION) ×3 IMPLANT
PACK VAGINAL MINOR WOMEN LF (CUSTOM PROCEDURE TRAY) ×3 IMPLANT
PAD OB MATERNITY 4.3X12.25 (PERSONAL CARE ITEMS) ×3 IMPLANT
PAD PREP 24X48 CUFFED NSTRL (MISCELLANEOUS) ×3 IMPLANT
SUT PROLENE 1 CT 1 30 (SUTURE) ×3 IMPLANT
TOWEL OR 17X24 6PK STRL BLUE (TOWEL DISPOSABLE) ×6 IMPLANT
TRAY FOLEY CATH 14FR (SET/KITS/TRAYS/PACK) ×3 IMPLANT
TUBING NON-CON 1/4 X 20 CONN (TUBING) ×2 IMPLANT
TUBING NON-CON 1/4 X 20' CONN (TUBING) ×1
YANKAUER SUCT BULB TIP NO VENT (SUCTIONS) ×3 IMPLANT

## 2014-01-13 NOTE — Anesthesia Postprocedure Evaluation (Signed)
  Anesthesia Post-op Note  Anesthesia Post Note  Patient: Amy [name]  Procedure(s) Performed: Procedure(s) (LRB): CERCLAGE CERVICAL (N/A)  Anesthesia type: Spinal  Patient location: PACU  Post pain: Pain level controlled  Post assessment: Post-op Vital signs reviewed  Last Vitals:  Filed Vitals:   01/13/14 1752  BP: 96/59  Pulse: 73  Temp: 36.9 C  Resp: 18    Post vital signs: Reviewed  Level of consciousness: awake  Complications: No apparent anesthesia complications

## 2014-01-13 NOTE — Op Note (Signed)
Surgeon: Scheryl DarterARNOLD,JAMES   Assistants: none. Anesthesia: Spinal  ASA Class: 1 Procedure: Cervical cerclage Preop diagnosis: Incompetent cervix at 13.[redacted] weeks gestation Postop diagnosis: Same Estimated blood loss: Less than 25 mL Complications: None Drains: None Counts: Correct  The patient gave written consent for cervical cerclage due to incompetent cervix at 13.[redacted] weeks gestation. Patient identification was confirmed and she was brought to the OR and spinal anesthesia was induced. She was placed in dorsal lithotomy position. Her perineum and vagina were sterilely prepped and draped. The bladder was drained with the Foley catheter. Exam revealed a long closed cervix. A weighted speculum was placed. The cervix was visualized with Deaver retractors. Ring forceps were used to grasp the cervical mucosa at 3:00 and 9:00. #1 Prolene was used for the cerclage. The first stitch was placed from 1200 on the anterior cervix at the bladder reflection. Second stitch was placed at 3:00.The third stitch was placed from 600, and the last at 900 suture was tied at 12:00. Good placement was seen with a suture about 3 cm back from the end of the cervix. All instruments were removed. There was minimal bleeding. Vaginal packing with 2 inch gauze was placed to be removed about 30 minutes postoperatively. Patient tolerated the procedure well without complications. She was brought in stable condition to the PACU.  Scheryl DarterJames Arnold MD 01/13/2014 Adam PhenixJames G Arnold, MD

## 2014-01-13 NOTE — Anesthesia Procedure Notes (Signed)
Spinal  Patient location during procedure: OR Start time: 01/13/2014 2:20 PM Staffing Anesthesiologist: CASSIDY, AMY Performed by: anesthesiologist  Preanesthetic Checklist Completed: patient identified, site marked, surgical consent, pre-op evaluation, timeout performed, IV checked, risks and benefits discussed and monitors and equipment checked Spinal Block Patient position: sitting Prep: site prepped and draped and DuraPrep Patient monitoring: heart rate, continuous pulse ox and blood pressure Approach: midline Location: L3-4 Injection technique: single-shot Needle Needle type: Pencan  Needle gauge: 24 G Needle length: 9 cm Assessment Sensory level: T10 Additional Notes Clear free flow CSF on first attempt.  No paresthesia.  Patient tolerated procedure well with no apparent complications.  Allowed to sit for 1 minute for saddle block.  Jasmine DecemberA. Cassidy, MD

## 2014-01-13 NOTE — Transfer of Care (Signed)
Immediate Anesthesia Transfer of Care Note  Patient: Amy [name]  Procedure(s) Performed: Procedure(s): CERCLAGE CERVICAL (N/A)  Patient Location: PACU  Anesthesia Type:Spinal  Level of Consciousness: awake, alert  and oriented  Airway & Oxygen Therapy: Patient Spontanous Breathing  Post-op Assessment: Report given to PACU RN and Post -op Vital signs reviewed and stable  Post vital signs: Reviewed and stable  Complications: No apparent anesthesia complications

## 2014-01-13 NOTE — H&P (Signed)
Amy [name] is an 33 y.o. female. [id number] Patient's last menstrual period was 10/12/2013. 3715w6d History of incompetent cervix scheduled for cerclage today. The procedure and the risk of anesthesia, bleeding, infection, bowel and bladder injury, failure, ruptured membranes were discussed and her questions were answered. Counseled via interpreter.    Pertinent Gynecological History: Menses: pregnant      OB History: G5, P3   Menstrual History:  Patient's last menstrual period was 10/12/2013.    Past Medical History  Diagnosis Date  . Medical history non-contributory     Past Surgical History  Procedure Laterality Date  . Cesarean section      one previous    Family History  Problem Relation Age of Onset  . Diabetes Father   . Diabetes Sister     Social History:  reports that she has never smoked. She does not have any smokeless tobacco history on file. She reports that she does not drink alcohol or use illicit drugs.  Allergies:  Allergies  Allergen Reactions  . Penicillins Rash    Prescriptions prior to admission  Medication Sig Dispense Refill  . ketoconazole (NIZORAL) 2 % cream Apply 1 application topically daily.  60 g  1  . metoCLOPramide (REGLAN) 10 MG tablet Take 1 tablet (10 mg total) by mouth 3 (three) times daily with meals.  90 tablet  1  . metroNIDAZOLE (FLAGYL) 500 MG tablet Take 1 tablet (500 mg total) by mouth 2 (two) times daily.  14 tablet  0    Review of Systems  Constitutional: Negative.   Respiratory: Negative.   Genitourinary: Negative.     Blood pressure 91/54, pulse 77, temperature 98.1 F (36.7 C), temperature source Oral, resp. rate 18, last menstrual period 10/12/2013, SpO2 99.00%. Physical Exam  Vitals reviewed. Constitutional: She is oriented to person, place, and time. She appears well-developed. No distress.  Respiratory: Effort normal. No respiratory distress.  GI: Soft. She exhibits no mass.  Neurological:  She is alert and oriented to person, place, and time.  Skin: Skin is warm and dry.  Psychiatric: She has a normal mood and affect. Her behavior is normal.    No results found for this or any previous visit (from the past 24 hour(s)). CBC    Component Value Date/Time   WBC 10.0 12/28/2013 1048   RBC 4.26 12/28/2013 1048   HGB 13.0 12/28/2013 1048   HCT 36.7 12/28/2013 1048   PLT 282 12/28/2013 1048   MCV 86.2 12/28/2013 1048   MCH 30.5 12/28/2013 1048   MCHC 35.4 12/28/2013 1048   RDW 12.9 12/28/2013 1048   LYMPHSABS 1.5 12/28/2013 1048   MONOABS 0.5 12/28/2013 1048   EOSABS 0.3 12/28/2013 1048   BASOSABS 0.0 12/28/2013 1048     No results found.  Assessment/Plan: Incompetent cervix for cerclage  Adam PhenixJames G Goebel Hellums 01/13/2014, 1:35 PM

## 2014-01-13 NOTE — Anesthesia Preprocedure Evaluation (Addendum)
Anesthesia Evaluation  Patient identified by MRN, date of birth, ID band Patient awake    Reviewed: Allergy & Precautions, H&P , NPO status , Patient's Chart, lab work & pertinent test results, reviewed documented beta blocker date and time   History of Anesthesia Complications Negative for: history of anesthetic complications  Airway Mallampati: I TM Distance: >3 FB Neck ROM: full    Dental  (+) Partial Lower   Pulmonary neg pulmonary ROS,  breath sounds clear to auscultation        Cardiovascular negative cardio ROS  Rhythm:regular Rate:Normal     Neuro/Psych negative neurological ROS  negative psych ROS   GI/Hepatic negative GI ROS, Neg liver ROS,   Endo/Other  negative endocrine ROS  Renal/GU negative Renal ROS  negative genitourinary   Musculoskeletal   Abdominal   Peds  Hematology negative hematology ROS (+)   Anesthesia Other Findings   Reproductive/Obstetrics (+) Pregnancy (13 weeks, incompetent cervix)                           Anesthesia Physical Anesthesia Plan  ASA: II  Anesthesia Plan: Spinal   Post-op Pain Management:    Induction:   Airway Management Planned:   Additional Equipment:   Intra-op Plan:   Post-operative Plan:   Informed Consent: I have reviewed the patients History and Physical, chart, labs and discussed the procedure including the risks, benefits and alternatives for the proposed anesthesia with the patient or authorized representative who has indicated his/her understanding and acceptance.     Plan Discussed with: Surgeon and CRNA  Anesthesia Plan Comments:         Anesthesia Quick Evaluation

## 2014-01-13 NOTE — Discharge Instructions (Signed)
Cerclaje - Cuidados posteriores (Cerclage, Care After) Siga estas instrucciones durante las prximas semanas. Estas indicaciones le proporcionan informacin general acerca de cmo deber cuidarse despus del procedimiento. El mdico tambin podr darle instrucciones ms especficas. El tratamiento se ha planificado de acuerdo a las prcticas mdicas actuales, pero a veces se producen problemas. Comunquese con el mdico si tiene algn problema o tiene dudas despus del procedimiento. QU ESPERAR DESPUS DEL PROCEDIMIENTO  Despus del procedimiento, es tpico tener las siguientes sensaciones:  Clicos abdominales.  Prdida o hemorragia vaginal. INSTRUCCIONES PARA EL CUIDADO EN EL HOGAR   Slo tome medicamentos de venta libre o recetados para Primary school teachercalmar el dolor, Environmental health practitionerel malestar o bajar la fiebre, segn las indicaciones de su mdico.  Evite las actividades fsicas y los ejercicios hasta que su mdico la autorice.  No  se haga duchas vaginales ni tenga relaciones sexuales hasta que el mdico la autorice.  Cumpla con todas las visitas de control, segn le indique su mdico. SOLICITE ATENCIN MDICA SI:   Tiene flujo vaginal anormal.  Tiene una erupcin.  Se siente mareada o se desmaya.  Aumenta el dolor abdominal y no puede controlarlo con Tourist information centre managerla medicacin. SOLICITE ATENCIN MDICA DE INMEDIATO SI:   Presenta una hemorragia vaginal abundante.  Tiene una prdida de lquido importante o sale lquido a chorros por Sales executivela vagina.  Tiene fiebre.  Se desmaya.  Siente contracciones en el tero.  Siente que el beb no se mueve tanto como antes o no puede sentir los movimientos del beb.  Siente falta de aire o Journalist, newspaperdolor en el pecho. Document Released: 06/03/2013 [facility] Patient Information 2014 [place], [place].

## 2014-01-14 ENCOUNTER — Encounter (HOSPITAL_COMMUNITY): Payer: Self-pay | Admitting: Obstetrics & Gynecology

## 2014-01-19 ENCOUNTER — Encounter: Payer: Self-pay | Admitting: *Deleted

## 2014-01-25 ENCOUNTER — Ambulatory Visit (INDEPENDENT_AMBULATORY_CARE_PROVIDER_SITE_OTHER): Payer: Self-pay | Admitting: Family Medicine

## 2014-01-25 VITALS — BP 90/58 | HR 86 | Temp 97.5°F | Wt 139.0 lb

## 2014-01-25 DIAGNOSIS — IMO0002 Reserved for concepts with insufficient information to code with codable children: Secondary | ICD-10-CM

## 2014-01-25 DIAGNOSIS — R6889 Other general symptoms and signs: Secondary | ICD-10-CM

## 2014-01-25 DIAGNOSIS — Z3480 Encounter for supervision of other normal pregnancy, unspecified trimester: Secondary | ICD-10-CM

## 2014-01-25 DIAGNOSIS — O34219 Maternal care for unspecified type scar from previous cesarean delivery: Secondary | ICD-10-CM

## 2014-01-25 DIAGNOSIS — O3430 Maternal care for cervical incompetence, unspecified trimester: Secondary | ICD-10-CM

## 2014-01-25 DIAGNOSIS — Z3490 Encounter for supervision of normal pregnancy, unspecified, unspecified trimester: Secondary | ICD-10-CM

## 2014-01-25 MED ORDER — TRIAMCINOLONE 0.1 % CREAM:EUCERIN CREAM 1:1: 1.0000 "application " | TOPICAL_CREAM | Freq: Two times a day (BID) | CUTANEOUS | Status: DC

## 2014-01-25 NOTE — Patient Instructions (Signed)
Segundo trimestre del embarazo  (Second Trimester of Pregnancy) El segundo trimestre del embarazo se extiende desde la semana 13 hasta la semana 28, del 4 al 6 mes. En general, es el momento del embarazo en el que se sentir mejor. Su organismo se ha adaptado a estar embarazada y comienza a sentirse fsicamente mejor. En general las nuseas matutinas han disminuido o han desaparecido completamente. El segundo trimestre es tambin la poca en la que el feto se desarrolla rpidamente. Hacia el final del sexto mes, el beb mide aproximadamente 9 pulgadas (23 cm) y pesa alrededor de 1  libras (700 g). Es probable que sienta mover al beb (dar pataditas) entre las 18 y 20 semanas del embarazo.  CAMBIOS CORPORALES  Su organismo atravesar numerosos cambios durante el embarazo. Los cambios varan de una mujer a otra.   Seguir aumentando de peso. Notar que la parte baja del abdomen se ensancha.  Podrn aparecer las primeras estras en las caderas, abdomen y mamas.  Es posible que sienta cefaleas, que se pueden aliviar con los medicamentos que su mdico le autorice a utilizar.  Tendr necesidad de orinar con ms frecuencia porque el feto est presionando en la vejiga.  Como consecuencia del embarazo, podr sentir acidez estomacal continuamente.  Podr estar constipada ya que ciertas hormonas hacen que los msculos que empujan los desechos a travs de los intestinos trabajen ms lentamente.  Pueden aparecer hemorroides o abultarse las venas (venas varicosas).  El dolor de espalda se debe al aumento de peso y a que las hormonas del embarazo relajan las articulaciones entre los huesos de la pelvis y a la modificacin del peso y a los msculos que soportan el equilibrio.  Sus mamas seguirn desarrollndose y estarn ms sensibles.  Las encas pueden sangrar y estar sensibles al cepillado y al hilo dental.  Pueden aparecer en el rostro zonas oscuras o manchas (cloasma, mscara del embarazo). Esto  desaparece despus del nacimiento del beb.  Es posible que se formeuna lnea oscura desde el ombligo a la zona del pubis (linea nigra). Esto desaparece despus del nacimiento del beb. QU DEBE ESPERAR EN LAS CONSULTAS PRENATALES  Durante una visita prenatal de rutina:   La pesarn para verificar que usted y el feto se encuentran dentro de los lmites normales.  Le tomarn la presin arterial.  Le medirn el abdomen para verificar el desarrollo del beb.  Escucharn los latidos fetales.  Se evaluarn los resultados de los estudios realizados en visitas anteriores. El mdico le preguntar:   Cmo se siente.  Si siente los movimientos del beb.  Si tiene sntomas anormales, como prdida de lquido, sangrado, dolores de cabeza intensos o clicos abdominales.  Si tiene alguna duda. Otros estudios que podrn realizarse durante el segundo trimestre son:   Anlisis de sangre para evaluar:  Niveles bajos de hierro (anemia).  Diabetes gestacional (entre las 24 y las 28 semanas).  Anticuerpos Rh.  Anlisis de orina para detectar infecciones, diabetes o protenas en la orina.  Una ecografa para confirmar si el crecimiento y el desarrollo del beb son los adecuados.  Una amniocentesis para diagnosticar posibles problemas genticos.  Estudios del feto para descartar espina bfida y sndrome de Down. INSTRUCCIONES PARA EL CUIDADO EN EL HOGAR   Evite fumar, consumir hierbas, beber alcohol y utilizar frmacos que no ne hayan recetado. Estas sustancias qumicas afectan la formacin y el desarrollo del beb.  Siga las indicaciones del profesional con respecto a como tomar los medicamentos. Durante el embarazo, hay   medicamentos que son seguros y otros no lo son.  Realice actividad fsica slo segn las indicaciones del mdico. Sentir clicos uterinos es el mejor signo para detener la actividad fsica.  Contine haciendo comidas regulares y sanas.  Use un sostn que le brinde buen  soporte si sus mamas estn sensibles.  No utilice la baera con agua caliente, baos turcos y saunas.  Colquese el cinturn de seguridad cuando conduzca.  Evite comer carne cruda y el contacto con los utensilios y desperdicios de los gatos. Estos elementos contienen grmenes que pueden causar defectos de nacimiento en el beb.  Tome las vitaminas indicadas para la etapa prenatal.  Pruebe un laxante (si el mdico la autoriza) si tiene constipacin. Consuma ms alimentos ricos en fibra, como vegetales y frutas frescos y cereales enteros. Beba gran cantidad de lquido para mantener la orina de tono claro o amarillo plido.  Tome baos de agua tibia para calmar el dolor o las molestias causadas por las hemorroides. Use una crema para las hemorroides si el mdico la autoriza.  Si tiene venas varicosas, use medias de soporte. Eleve los pies durante 15 minutos, 3 o 4 veces por da. Limite el consumo de sal en su dieta.  Evite levantar objetos pesados, use zapatos de tacones bajos y mantenga una buena postura.  Descanse con las piernas elevadas si tiene calambres o dolor de cintura.  Visite a su dentista si no lo ha hecho durante el embarazo. Use un cepillo de dientes blando para higienizarse los dientes y use suavemente el hilo dental.  Puede continuar su vida sexual siempre que el mdico la autorice.  Concurra a todas las visitas prenatales segn las indicaciones de su mdico. SOLICITE ATENCIN MDICA SI:   Tiene mareos.  Siente clicos leves, presin en la pelvis o dolor persistente en el abdomen.  Tiene nuseas o vmitos o diarrea persistentes.  Observa una secrecin vaginal con mal olor.  Siente dolor al orinar. SOLICITE ATENCIN MDICA DE INMEDIATO SI:   Tiene fiebre.  Pierde lquido por la vagina.  Tiene sangrando o pequeas prdidas vaginales.  Siente dolor intenso o clicos en el abdomen.  Sube o baja de peso rpidamente.  Tiene dificultad para respirar y le duele  pecho.  Sbitamente se le hincha el rostro, las manos, los tobillos, los pies o las piernas de manera extrema.  No ha sentido los movimientos del beb durante una hora.  Siente un dolor de cabeza intenso que no se alivia con medicamentos.  Su visin se modifica. Document Released: 05/23/2005 Document Revised: 04/15/2013 Exit[employer] Patient Information 2014 [employer].  

## 2014-01-25 NOTE — Progress Notes (Signed)
33 yo [id number] who presents for ROBV - cerclage placed 5/20- recovery was better than prior cerclages - doing well - no vb, lof, ctx. +FM - rash on legs- comes in pregnancy. Given fluconazole last month but didn't work  O: see flowsheet  A/P   Cerclage checked on speculum exam- stitch in place. 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 rx of triamcinolong lotion.  F/u in 4 weeks. Needs AFP then

## 2014-01-25 NOTE — Progress Notes (Signed)
C/o pelvic pain and vaginal pain =5.

## 2014-02-05 ENCOUNTER — Inpatient Hospital Stay (HOSPITAL_COMMUNITY)
Admission: AD | Admit: 2014-02-05 | Discharge: 2014-02-06 | Disposition: A | Discharge status: Home / Self Care | Payer: Self-pay | Source: Ambulatory Visit | Attending: Obstetrics & Gynecology | Admitting: Obstetrics & Gynecology

## 2014-02-05 DIAGNOSIS — R109 Unspecified abdominal pain: Secondary | ICD-10-CM | POA: Insufficient documentation

## 2014-02-05 DIAGNOSIS — A499 Bacterial infection, unspecified: Secondary | ICD-10-CM | POA: Insufficient documentation

## 2014-02-05 DIAGNOSIS — N760 Acute vaginitis: Secondary | ICD-10-CM | POA: Insufficient documentation

## 2014-02-05 DIAGNOSIS — N949 Unspecified condition associated with female genital organs and menstrual cycle: Secondary | ICD-10-CM

## 2014-02-05 DIAGNOSIS — O2390 Unspecified genitourinary tract infection in pregnancy, unspecified trimester: Secondary | ICD-10-CM | POA: Insufficient documentation

## 2014-02-05 DIAGNOSIS — Z833 Family history of diabetes mellitus: Secondary | ICD-10-CM | POA: Insufficient documentation

## 2014-02-05 DIAGNOSIS — B9689 Other specified bacterial agents as the cause of diseases classified elsewhere: Secondary | ICD-10-CM | POA: Insufficient documentation

## 2014-02-06 ENCOUNTER — Encounter (HOSPITAL_COMMUNITY): Payer: Self-pay | Admitting: *Deleted

## 2014-02-06 DIAGNOSIS — B9689 Other specified bacterial agents as the cause of diseases classified elsewhere: Secondary | ICD-10-CM

## 2014-02-06 DIAGNOSIS — N760 Acute vaginitis: Secondary | ICD-10-CM

## 2014-02-06 DIAGNOSIS — A499 Bacterial infection, unspecified: Secondary | ICD-10-CM

## 2014-02-06 LAB — WET PREP, GENITAL
Trich, Wet Prep: NONE SEEN
Yeast Wet Prep HPF POC: NONE SEEN

## 2014-02-06 MED ORDER — METRONIDAZOLE 500 MG PO TABS: 500.0000 mg | ORAL_TABLET | Freq: Two times a day (BID) | ORAL | Status: DC

## 2014-02-06 NOTE — MAU Provider Note (Signed)
History     CSN: [id number]  Arrival date and time: 02/05/14 2351   First Provider Initiated Contact with Patient 02/06/14 0214      Chief Complaint  Patient presents with  . Vaginal Pain   HPI Ms. Amy [name] is a 33 y.o. [id number] at 5359w2d who presents to MAU today with complaint of vaginal discharge and swelling. The patient had a cerclage placed on 01/13/14. She states that she has noted an increased in white discharge with odor. She feels a pinching sensation in the pelvic area occasionally and bilateral occasional lower abdominal pain. She states occasional nausea without vomiting. She denies vaginal bleeding or fever.   OB History   Grav Para Term Preterm Abortions TAB SAB Ect Mult Living   5 3 3  0 1  1   3       Past Medical History  Diagnosis Date  . Medical history non-contributory     Past Surgical History  Procedure Laterality Date  . Cesarean section      one previous  . Cervical cerclage N/A 01/13/2014    Procedure: CERCLAGE CERVICAL;  Surgeon: Adam PhenixJames G Arnold, MD;  Location: [facility];  Service: Gynecology;  Laterality: N/A;    Family History  Problem Relation Age of Onset  . Diabetes Father   . Diabetes Sister     History  Substance Use Topics  . Smoking status: Never Smoker   . Smokeless tobacco: Not on file  . Alcohol Use: No    Allergies:  Allergies  Allergen Reactions  . Penicillins Rash    Prescriptions prior to admission  Medication Sig Dispense Refill  . metoCLOPramide (REGLAN) 10 MG tablet Take 1 tablet (10 mg total) by mouth 3 (three) times daily with meals.  90 tablet  1  . oxyCODONE-acetaminophen (PERCOCET/ROXICET) 5-325 MG per tablet Take 1-2 tablets by mouth every 6 (six) hours as needed.  15 tablet  0  . Prenatal Vit-Fe Fumarate-FA (PRENATAL VITAMINS PLUS) 27-1 MG TABS Take 1 tablet by mouth daily.      . Triamcinolone Acetonide (TRIAMCINOLONE 0.1 % CREAM : EUCERIN) CREA Apply 1 application topically 2 (two) times daily.   1 each  1    Review of Systems  Constitutional: Negative for fever and malaise/fatigue.  Gastrointestinal: Positive for nausea and abdominal pain. Negative for vomiting and diarrhea.  Genitourinary: Negative for dysuria, urgency and frequency.       + vaginal discharge Neg - vaginal bleeding   Physical Exam   Blood pressure 99/51, pulse 89, temperature 98.6 F (37 C), resp. rate 18, height 4\' 11"  (1.499 m), weight 143 lb 6.4 oz (65.046 kg), last menstrual period 10/12/2013.  Physical Exam  Constitutional: She is oriented to person, place, and time. She appears well-developed and well-nourished. No distress.  HENT:  Head: Normocephalic and atraumatic.  Cardiovascular: Normal rate.   Respiratory: Effort normal.  GI: Soft. She exhibits no distension and no mass. There is no tenderness. There is no rebound and no guarding.  Genitourinary: There is no rash or tenderness on the right labia. There is no rash or tenderness on the left labia. Uterus is enlarged (appropriate for GA). Uterus is not tender. Cervix exhibits no motion tenderness, no discharge and no friability. No bleeding around the vagina. Vaginal discharge (moderate amount of thin, white discharge noted) found.  Cervix: visually closed and cerclage appears intact. Able to palpate cerclage knot and closed cervical os.  Neurological: She is alert and oriented to person, place, and  time.  Skin: Skin is warm and dry. No erythema.  Psychiatric: She has a normal mood and affect.   Results for orders placed during the hospital encounter of 02/05/14 (from the past 24 hour(s))  WET PREP, GENITAL     Status: Abnormal   Collection Time    02/06/14  2:20 AM      Result Value Ref Range   Yeast Wet Prep HPF POC NONE SEEN  NONE SEEN   Trich, Wet Prep NONE SEEN  NONE SEEN   Clue Cells Wet Prep HPF POC FEW (*) NONE SEEN   WBC, Wet Prep HPF POC MODERATE (*) NONE SEEN    MAU Course  Procedures None  MDM FHR - 141 bpm with doppler Wet  prep and GC/Chlamydia today  Assessment and Plan  A: SIUP at 9431w2d Cerclage intact Bacterial vaginosis Round ligament pain  P: Discharge home Rx for Flagyl sent to patient's pharmacy Advised Tylenol PRN pain Follow-up in the [facility] as scheduled or sooner if symptoms persist or worsen Patient may return to MAU as needed or if her condition were to change or worsen  Freddi StarrJulie N Ethier, PA-C  02/06/2014, 3:06 AM

## 2014-02-06 NOTE — MAU Provider Note (Signed)
Attestation of Attending Supervision of Advanced Practitioner (PA/CNM/NP): Evaluation and management procedures were performed by the Advanced Practitioner under my supervision and collaboration.  I have reviewed the Advanced Practitioner's note and chart, and I agree with the management and plan.  Aracelie Addis, MD, FACOG Attending Obstetrician & Gynecologist Faculty Practice, [facility]  

## 2014-02-06 NOTE — MAU Note (Signed)
Having vaginal pain and swelling. White d/c with some itching. Symptoms present for one day

## 2014-02-06 NOTE — MAU Note (Signed)
Pt stated she is having vaginal swelling and feels pain in her pelvic bones. Pt has cerclage in place and is afraid it might be opening up.

## 2014-02-06 NOTE — Discharge Instructions (Signed)
Vaginosis bacteriana  (Bacterial Vaginosis)  La vaginosis bacteriana es una infección de la vagina. Se produce cuando una cantidad excesiva de ciertos gérmenes (bacterias) crece en la vagina.  CUIDADOS EN EL HOGAR  · Tome los medicamentos tal como se lo indicó su médico.  · Finalice la prescripción completa, aunque comience a sentirse mejor.  · No mantenga relaciones sexuales hasta que finalice sus medicamentos y se sienta mejor.  · Comunique a sus compañeros sexuales que sufre una infección. Deben consultar a su médico para iniciar un tratamiento.  · Practique el sexo seguro. Use preservativos. Tenga solo un compañero sexual.  SOLICITE AYUDA SI:  · No mejora luego de 3 días de tratamiento.  · Observa una secreción (pérdida) de color gris más abundante que proviene de la vagina.  · Siente más dolor que antes.  · Tiene fiebre.  ASEGÚRESE DE QUE:   · Comprende estas instrucciones.  · Controlará su afección.  · Recibirá ayuda de inmediato si no mejora o si empeora.  Document Released: 11/09/2008 Document Revised: 06/03/2013  ExitCare® Patient Information ©2014 [employer], LLC.

## 2014-02-08 ENCOUNTER — Inpatient Hospital Stay (HOSPITAL_COMMUNITY)
Admission: AD | Admit: 2014-02-08 | Discharge: 2014-02-08 | Disposition: A | Discharge status: Home / Self Care | Payer: Self-pay | Source: Ambulatory Visit | Attending: Obstetrics & Gynecology | Admitting: Obstetrics & Gynecology

## 2014-02-08 ENCOUNTER — Encounter (HOSPITAL_COMMUNITY): Payer: Self-pay | Admitting: *Deleted

## 2014-02-08 DIAGNOSIS — B9689 Other specified bacterial agents as the cause of diseases classified elsewhere: Secondary | ICD-10-CM | POA: Insufficient documentation

## 2014-02-08 DIAGNOSIS — A499 Bacterial infection, unspecified: Secondary | ICD-10-CM | POA: Insufficient documentation

## 2014-02-08 DIAGNOSIS — O2390 Unspecified genitourinary tract infection in pregnancy, unspecified trimester: Secondary | ICD-10-CM | POA: Insufficient documentation

## 2014-02-08 DIAGNOSIS — N760 Acute vaginitis: Secondary | ICD-10-CM | POA: Insufficient documentation

## 2014-02-08 DIAGNOSIS — N949 Unspecified condition associated with female genital organs and menstrual cycle: Secondary | ICD-10-CM

## 2014-02-08 DIAGNOSIS — R109 Unspecified abdominal pain: Secondary | ICD-10-CM | POA: Insufficient documentation

## 2014-02-08 DIAGNOSIS — Z833 Family history of diabetes mellitus: Secondary | ICD-10-CM | POA: Insufficient documentation

## 2014-02-08 LAB — URINE MICROSCOPIC-ADD ON

## 2014-02-08 LAB — URINALYSIS, ROUTINE W REFLEX MICROSCOPIC
Bilirubin Urine: NEGATIVE
Glucose, UA: NEGATIVE mg/dL
KETONES UR: NEGATIVE mg/dL
Leukocytes, UA: NEGATIVE
Nitrite: NEGATIVE
PH: 5.5 (ref 5.0–8.0)
Protein, ur: NEGATIVE mg/dL
Urobilinogen, UA: 0.2 mg/dL (ref 0.0–1.0)

## 2014-02-08 LAB — GC/CHLAMYDIA PROBE AMP
CT Probe RNA: NEGATIVE
GC Probe RNA: NEGATIVE

## 2014-02-08 NOTE — MAU Provider Note (Signed)
History     CSN: [id number]  Arrival date and time: 02/08/14 [id number]   First Provider Initiated Contact with Patient 02/08/14 2010      Chief Complaint  Patient presents with  . Abdominal Pain   Abdominal Pain    Amy [name] is a 33 y.o. [id number] at 7468w4d who presents today with bilateral lower abdominal pain. She also reports vaginal discharge. She was seen on 02/06/14, and given RX for flagyl. However, she reports that she only took 2 pills. She did not understand that she needed to take 2 pills per day until they were gone. She denies any VB or LOF, and reports that she sees something "grey at the opening of the vagina".   Past Medical History  Diagnosis Date  . Medical history non-contributory     Past Surgical History  Procedure Laterality Date  . Cesarean section      one previous  . Cervical cerclage N/A 01/13/2014    Procedure: CERCLAGE CERVICAL;  Surgeon: Adam PhenixJames G Arnold, MD;  Location: [facility];  Service: Gynecology;  Laterality: N/A;    Family History  Problem Relation Age of Onset  . Diabetes Father   . Diabetes Sister     History  Substance Use Topics  . Smoking status: Never Smoker   . Smokeless tobacco: Not on file  . Alcohol Use: No    Allergies:  Allergies  Allergen Reactions  . Penicillins Rash    Prescriptions prior to admission  Medication Sig Dispense Refill  . metoCLOPramide (REGLAN) 10 MG tablet Take 1 tablet (10 mg total) by mouth 3 (three) times daily with meals.  90 tablet  1  . metroNIDAZOLE (FLAGYL) 500 MG tablet Take 1 tablet (500 mg total) by mouth 2 (two) times daily.  14 tablet  0  . Prenatal Vit-Fe Fumarate-FA (PRENATAL VITAMINS PLUS) 27-1 MG TABS Take 1 tablet by mouth daily.      . Triamcinolone Acetonide (TRIAMCINOLONE 0.1 % CREAM : EUCERIN) CREA Apply 1 application topically 2 (two) times daily.  1 each  1    Review of Systems  Gastrointestinal: Positive for abdominal pain.   Physical Exam   Blood pressure  98/63, pulse 95, temperature 98.2 F (36.8 C), temperature source Oral, resp. rate 18, height 4' 11.2" (1.504 m), weight 63.776 kg (140 lb 9.6 oz), last menstrual period 10/12/2013, SpO2 99.00%.  Physical Exam  Nursing note and vitals reviewed. Constitutional: She is oriented to person, place, and time. She appears well-developed and well-nourished. No distress.  Cardiovascular: Normal rate.   Respiratory: Effort normal.  GI: Soft. There is no tenderness. There is no rebound.  Genitourinary:   External: no lesion Vagina: small cystocele. small amount of white discharge Cervix: pink, smooth, no CMT. Closed/thick, cerclage sutures visible  Uterus: AGA   Neurological: She is alert and oriented to person, place, and time.  Skin: Skin is warm and dry.  Psychiatric: She has a normal mood and affect.   FHT with doppler 140 MAU Course  Procedures  Results for orders placed during the hospital encounter of 02/08/14 (from the past 24 hour(s))  URINALYSIS, ROUTINE W REFLEX MICROSCOPIC     Status: Abnormal   Collection Time    02/08/14  8:06 PM      Result Value Ref Range   Color, Urine YELLOW  YELLOW   APPearance CLEAR  CLEAR   Specific Gravity, Urine >1.030 (*) 1.005 - 1.030   pH 5.5  5.0 - 8.0   Glucose,  UA NEGATIVE  NEGATIVE mg/dL   Hgb urine dipstick TRACE (*) NEGATIVE   Bilirubin Urine NEGATIVE  NEGATIVE   Ketones, ur NEGATIVE  NEGATIVE mg/dL   Protein, ur NEGATIVE  NEGATIVE mg/dL   Urobilinogen, UA 0.2  0.0 - 1.0 mg/dL   Nitrite NEGATIVE  NEGATIVE   Leukocytes, UA NEGATIVE  NEGATIVE  URINE MICROSCOPIC-ADD ON     Status: Abnormal   Collection Time    02/08/14  8:06 PM      Result Value Ref Range   Squamous Epithelial / LPF FEW (*) RARE   WBC, UA 0-2  <3 WBC/hpf   Bacteria, UA FEW (*) RARE   Urine-Other MUCOUS PRESENT       Assessment and Plan   1. Round ligament pain   2. BV (bacterial vaginosis)    2nd trimester danger signs reviewed RL comfort measures Continue  flagyl until all the pills are gone  Follow-up Information   [facility] (As [facility])    Specialty:  Obstetrics and Gynecology   Contact information:   [address] [place] [id number] [phone]       Tawnya CrookHogan, Enrigue Hashimi Donovan 02/08/2014, 8:34 PM

## 2014-02-08 NOTE — MAU Provider Note (Signed)
Attestation of Attending Supervision of Advanced Practitioner (CNM/NP): Evaluation and management procedures were performed by the Advanced Practitioner under my supervision and collaboration.  I have reviewed the Advanced Practitioner's note and chart, and I agree with the management and plan.  HARRAWAY-SMITH, Caelum Federici 8:45 PM      

## 2014-02-08 NOTE — Discharge Instructions (Signed)
Vaginosis bacteriana (Bacterial Vaginosis) La vaginosis bacteriana es una infeccin de la vagina. Se produce cuando una cantidad excesiva de ciertos grmenes (bacterias) crece en la vagina. CUIDADOS EN EL HOGAR  Tome los medicamentos tal como se lo indic su mdico.  Finalice la prescripcin completa, aunque comience a sentirse mejor.  No mantenga relaciones sexuales hasta que finalice sus medicamentos y se Therapist, occupationalsienta mejor.  Comunique a sus compaeros sexuales que sufre una infeccin. Deben consultar a su mdico para iniciar un tratamiento.  Practique el sexo seguro. Use preservativos. Tenga solo un compaero sexual. SOLICITE AYUDA SI:  No mejora luego de 3 das de Galesvilletratamiento.  Observa una secrecin (prdida) de color gris ms abundante que proviene de la vagina.  Siente ms dolor que antes.  Tiene fiebre. ASEGRESE DE QUE:   Comprende estas instrucciones.  Controlar su afeccin.  Recibir ayuda de inmediato si no mejora o si empeora. Document Released: 11/09/2008 Document Revised: 06/03/2013 [facility] Patient Information 2014 [employer] [place].  Round Ligament Pain During Pregnancy Round ligament pain is a sharp pain or jabbing feeling often felt in the lower belly or groin area on one or both sides. It is one of the most common complaints during pregnancy and is considered a normal part of pregnancy. It is most often felt during the second trimester.  Here is what you need to know about round ligament pain, including some tips to help you feel better.  Causes of Round Ligament Pain  Several thick ligaments surround and support your womb (uterus) as it grows during pregnancy. One of them is called the round ligament.  The round ligament connects the front part of the womb to your groin, the area where your legs attach to your pelvis. The round ligament normally tightens and relaxes slowly.  As your baby and womb grow, the round ligament stretches. That makes it more likely to  become strained.  Sudden movements can cause the ligament to tighten quickly, like a rubber band snapping. This causes a sudden and quick jabbing feeling.  Symptoms of Round Ligament Pain  Round ligament pain can be concerning and uncomfortable. But it is considered normal as your body changes during pregnancy.  The symptoms of round ligament pain include a sharp, sudden spasm in the belly. It usually affects the right side, but it may happen on both sides. The pain only lasts a few seconds.  Exercise may cause the pain, as will rapid movements such as:  sneezing coughing laughing rolling over in bed standing up too quickly  Treatment of Round Ligament Pain  Here are some tips that may help reduce your discomfort:  Pain relief. Take over-the-counter acetaminophen for pain, if necessary. Ask your doctor if this is OK.  Exercise. Get plenty of exercise to keep your stomach (core) muscles strong. Doing stretching exercises or prenatal yoga can be helpful. Ask your doctor which exercises are safe for you and your baby.  A helpful exercise involves putting your hands and knees on the floor, lowering your head, and pushing your backside into the air.  Avoid sudden movements. Change positions slowly (such as standing up or sitting down) to avoid sudden movements that may cause stretching and pain.  Flex your hips. Bend and flex your hips before you cough, sneeze, or laugh to avoid pulling on the ligaments.  Apply warmth. A heating pad or warm bath may be helpful. Ask your doctor if this is OK. Extreme heat can be dangerous to the baby.  You should try  to modify your daily activity level and avoid positions that may worsen the condition.  When to Call the Doctor/Midwife  Always tell your doctor or midwife about any type of pain you have during pregnancy. Round ligament pain is quick and doesn't last long.  Call your health care provider immediately if you have:  severe  pain fever chills pain on urination difficulty walking  Belly pain during pregnancy can be due to many different causes. It is important for your doctor to rule out more serious conditions, including pregnancy complications such as placenta abruption or non-pregnancy illnesses such as:  inguinal hernia appendicitis stomach, liver, and kidney problems Preterm labor pains may sometimes be mistaken for round ligament pain.

## 2014-02-08 NOTE — MAU Note (Signed)
Pt c/o abdominal pain that started Friday. States that she feels like there is a lump in the vagina that looks gray and feels like its opening. Denies vaginal bleeding but has a thick, clear discharge. Took tylenol yesterday afternoon.

## 2014-02-22 ENCOUNTER — Encounter: Payer: Self-pay | Admitting: Family Medicine

## 2014-02-22 ENCOUNTER — Ambulatory Visit (INDEPENDENT_AMBULATORY_CARE_PROVIDER_SITE_OTHER): Payer: Self-pay | Admitting: Family Medicine

## 2014-02-22 VITALS — BP 99/61 | HR 89 | Temp 96.8°F | Wt 142.5 lb

## 2014-02-22 DIAGNOSIS — Z3480 Encounter for supervision of other normal pregnancy, unspecified trimester: Secondary | ICD-10-CM

## 2014-02-22 DIAGNOSIS — Z3492 Encounter for supervision of normal pregnancy, unspecified, second trimester: Secondary | ICD-10-CM

## 2014-02-22 LAB — POCT URINALYSIS DIP (DEVICE)
Bilirubin Urine: NEGATIVE
Glucose, UA: NEGATIVE mg/dL
Hgb urine dipstick: NEGATIVE
Ketones, ur: NEGATIVE mg/dL
Nitrite: NEGATIVE
PROTEIN: NEGATIVE mg/dL
SPECIFIC GRAVITY, URINE: 1.015 (ref 1.005–1.030)
Urobilinogen, UA: 0.2 mg/dL (ref 0.0–1.0)
pH: 6 (ref 5.0–8.0)

## 2014-02-22 LAB — AFP TUMOR MARKER: AFP-Tumor Marker: 84.7 ng/mL — ABNORMAL HIGH (ref 0.0–8.0)

## 2014-02-22 NOTE — Progress Notes (Signed)
U/S scheduled 02/24/14 at 8 am.

## 2014-02-22 NOTE — Progress Notes (Signed)
S: 33 yo [id number] here for ROBV  - doing well.   - has been using triamcinolone cream on her legs and it seems to be helping.   - no trouble with cerclage. Doesn't even notice it.  - no vb, lof, ctx. +FM  O: see flowsheet  A/P - doing well.  - PTL precautions discussed - anatomy US scheduled.  - AFP drawn today.  - cont triamcinolone cream for likely nummular eczema

## 2014-02-22 NOTE — Patient Instructions (Signed)
Second Trimester of Pregnancy The second trimester is from week 13 through week 28, months 4 through 6. The second trimester is often a time when you feel your best. Your body has also adjusted to being pregnant, and you begin to feel better physically. Usually, morning sickness has lessened or quit completely, you may have more energy, and you may have an increase in appetite. The second trimester is also a time when the fetus is growing rapidly. At the end of the sixth month, the fetus is about 9 inches long and weighs about 1 pounds. You will likely begin to feel the baby move (quickening) between 18 and 20 weeks of the pregnancy. BODY CHANGES Your body goes through many changes during pregnancy. The changes vary from woman to woman.   Your weight will continue to increase. You will notice your lower abdomen bulging out.  You may begin to get stretch marks on your hips, abdomen, and breasts.  You may develop headaches that can be relieved by medicines approved by your health care provider.  You may urinate more often because the fetus is pressing on your bladder.  You may develop or continue to have heartburn as a result of your pregnancy.  You may develop constipation because certain hormones are causing the muscles that push waste through your intestines to slow down.  You may develop hemorrhoids or swollen, bulging veins (varicose veins).  You may have back pain because of the weight gain and pregnancy hormones relaxing your joints between the bones in your pelvis and as a result of a shift in weight and the muscles that support your balance.  Your breasts will continue to grow and be tender.  Your gums may bleed and may be sensitive to brushing and flossing.  Dark spots or blotches (chloasma, mask of pregnancy) may develop on your face. This will likely fade after the baby is born.  A dark line from your belly button to the pubic area (linea nigra) may appear. This will likely fade  after the baby is born.  You may have changes in your hair. These can include thickening of your hair, rapid growth, and changes in texture. Some women also have hair loss during or after pregnancy, or hair that feels dry or thin. Your hair will most likely return to normal after your baby is born. WHAT TO EXPECT AT YOUR PRENATAL VISITS During a routine prenatal visit:  You will be weighed to make sure you and the fetus are growing normally.  Your blood pressure will be taken.  Your abdomen will be measured to track your baby's growth.  The fetal heartbeat will be listened to.  Any test results from the previous visit will be discussed. Your health care provider may ask you:  How you are feeling.  If you are feeling the baby move.  If you have had any abnormal symptoms, such as leaking fluid, bleeding, severe headaches, or abdominal cramping.  If you have any questions. Other tests that may be performed during your second trimester include:  Blood tests that check for:  Low iron levels (anemia).  Gestational diabetes (between 24 and 28 weeks).  Rh antibodies.  Urine tests to check for infections, diabetes, or protein in the urine.  An ultrasound to confirm the proper growth and development of the baby.  An amniocentesis to check for possible genetic problems.  Fetal screens for spina bifida and Down syndrome. HOME CARE INSTRUCTIONS   Avoid all smoking, herbs, alcohol, and unprescribed   drugs. These chemicals affect the formation and growth of the baby.  Follow your health care provider's instructions regarding medicine use. There are medicines that are either safe or unsafe to take during pregnancy.  Exercise only as directed by your health care provider. Experiencing uterine cramps is a good sign to stop exercising.  Continue to eat regular, healthy meals.  Wear a good support bra for breast tenderness.  Do not use hot tubs, steam rooms, or saunas.  Wear your  seat belt at all times when driving.  Avoid raw meat, uncooked cheese, cat litter boxes, and soil used by cats. These carry germs that can cause birth defects in the baby.  Take your prenatal vitamins.  Try taking a stool softener (if your health care provider approves) if you develop constipation. Eat more high-fiber foods, such as fresh vegetables or fruit and whole grains. Drink plenty of fluids to keep your urine clear or pale yellow.  Take warm sitz baths to soothe any pain or discomfort caused by hemorrhoids. Use hemorrhoid cream if your health care provider approves.  If you develop varicose veins, wear support hose. Elevate your feet for 15 minutes, 3-4 times a day. Limit salt in your diet.  Avoid heavy lifting, wear low heel shoes, and practice good posture.  Rest with your legs elevated if you have leg cramps or low back pain.  Visit your dentist if you have not gone yet during your pregnancy. Use a soft toothbrush to brush your teeth and be gentle when you floss.  A sexual relationship may be continued unless your health care provider directs you otherwise.  Continue to go to all your prenatal visits as directed by your health care provider. SEEK MEDICAL CARE IF:   You have dizziness.  You have mild pelvic cramps, pelvic pressure, or nagging pain in the abdominal area.  You have persistent nausea, vomiting, or diarrhea.  You have a bad smelling vaginal discharge.  You have pain with urination. SEEK IMMEDIATE MEDICAL CARE IF:   You have a fever.  You are leaking fluid from your vagina.  You have spotting or bleeding from your vagina.  You have severe abdominal cramping or pain.  You have rapid weight gain or loss.  You have shortness of breath with chest pain.  You notice sudden or extreme swelling of your face, hands, ankles, feet, or legs.  You have not felt your baby move in over an hour.  You have severe headaches that do not go away with  medicine.  You have vision changes. Document Released: 08/07/2001 Document Revised: 08/18/2013 Document Reviewed: 10/14/2012 ExitCare Patient Information 2015 [employer]. This information is not intended to replace advice given to you by your health care provider. Make sure you discuss any questions you have with your health care provider.  

## 2014-02-22 NOTE — Progress Notes (Signed)
Pt reports rash on legs in not improving even though she is using medication

## 2014-02-24 ENCOUNTER — Other Ambulatory Visit: Payer: Self-pay | Admitting: Family Medicine

## 2014-02-24 ENCOUNTER — Ambulatory Visit (HOSPITAL_COMMUNITY)
Admission: RE | Admit: 2014-02-24 | Discharge: 2014-02-24 | Disposition: A | Discharge status: Home / Self Care | Payer: Self-pay | Source: Ambulatory Visit | Attending: Family Medicine | Admitting: Family Medicine

## 2014-02-24 DIAGNOSIS — Z3492 Encounter for supervision of normal pregnancy, unspecified, second trimester: Secondary | ICD-10-CM

## 2014-02-24 DIAGNOSIS — Z3689 Encounter for other specified antenatal screening: Secondary | ICD-10-CM | POA: Insufficient documentation

## 2014-03-04 ENCOUNTER — Telehealth: Payer: Self-pay

## 2014-03-04 NOTE — Telephone Encounter (Signed)
Message copied by Louanna RawAMPBELL, Corrisa Gibby M on Thu Mar 04, 2014  1:32 PM ------      Message from: Vale HavenBECK, KELI L      Created: Wed Mar 03, 2014  2:28 PM       I ordered the wrong AFP. Can we have pt come in for a redraw with the right order? ------

## 2014-03-04 NOTE — Telephone Encounter (Signed)
Called patient with interpreter Raquel. Patient states she can come in Monday at 0800 03/08/14 for lab draw (AFP). Message sent to admin pool to schedule patient.

## 2014-03-08 ENCOUNTER — Other Ambulatory Visit: Payer: Self-pay

## 2014-03-08 DIAGNOSIS — O3432 Maternal care for cervical incompetence, second trimester: Secondary | ICD-10-CM

## 2014-03-09 LAB — ALPHA FETOPROTEIN, MATERNAL
AFP: 100 [IU]/mL
Curr Gest Age: 21.4 wks.days
MoM for AFP: 1.56
Open Spina bifida: NEGATIVE
Osb Risk: 1:2360 {titer}

## 2014-03-22 ENCOUNTER — Ambulatory Visit (INDEPENDENT_AMBULATORY_CARE_PROVIDER_SITE_OTHER): Payer: Self-pay | Admitting: Obstetrics & Gynecology

## 2014-03-22 VITALS — BP 95/58 | HR 67 | Temp 97.8°F | Wt 149.6 lb

## 2014-03-22 DIAGNOSIS — B9689 Other specified bacterial agents as the cause of diseases classified elsewhere: Secondary | ICD-10-CM

## 2014-03-22 DIAGNOSIS — Z3492 Encounter for supervision of normal pregnancy, unspecified, second trimester: Secondary | ICD-10-CM

## 2014-03-22 DIAGNOSIS — O34219 Maternal care for unspecified type scar from previous cesarean delivery: Secondary | ICD-10-CM

## 2014-03-22 DIAGNOSIS — O9989 Other specified diseases and conditions complicating pregnancy, childbirth and the puerperium: Secondary | ICD-10-CM

## 2014-03-22 DIAGNOSIS — N760 Acute vaginitis: Secondary | ICD-10-CM

## 2014-03-22 DIAGNOSIS — O3432 Maternal care for cervical incompetence, second trimester: Secondary | ICD-10-CM

## 2014-03-22 DIAGNOSIS — L259 Unspecified contact dermatitis, unspecified cause: Secondary | ICD-10-CM

## 2014-03-22 DIAGNOSIS — O99891 Other specified diseases and conditions complicating pregnancy: Secondary | ICD-10-CM

## 2014-03-22 DIAGNOSIS — O99712 Diseases of the skin and subcutaneous tissue complicating pregnancy, second trimester: Secondary | ICD-10-CM

## 2014-03-22 DIAGNOSIS — Z3480 Encounter for supervision of other normal pregnancy, unspecified trimester: Secondary | ICD-10-CM

## 2014-03-22 DIAGNOSIS — A499 Bacterial infection, unspecified: Secondary | ICD-10-CM

## 2014-03-22 DIAGNOSIS — L309 Dermatitis, unspecified: Secondary | ICD-10-CM

## 2014-03-22 DIAGNOSIS — B360 Pityriasis versicolor: Secondary | ICD-10-CM

## 2014-03-22 DIAGNOSIS — N898 Other specified noninflammatory disorders of vagina: Secondary | ICD-10-CM

## 2014-03-22 DIAGNOSIS — O3430 Maternal care for cervical incompetence, unspecified trimester: Secondary | ICD-10-CM

## 2014-03-22 DIAGNOSIS — O26892 Other specified pregnancy related conditions, second trimester: Secondary | ICD-10-CM

## 2014-03-22 LAB — POCT URINALYSIS DIP (DEVICE)
BILIRUBIN URINE: NEGATIVE
Glucose, UA: NEGATIVE mg/dL
HGB URINE DIPSTICK: NEGATIVE
Ketones, ur: NEGATIVE mg/dL
LEUKOCYTES UA: NEGATIVE
NITRITE: NEGATIVE
Protein, ur: NEGATIVE mg/dL
Specific Gravity, Urine: >=1.03 (ref 1.005–1.030)
Urobilinogen, UA: 0.2 mg/dL (ref 0.0–1.0)
pH: 5.5 (ref 5.0–8.0)

## 2014-03-22 MED ORDER — CLOBETASOL PROP EMOLLIENT BASE 0.05 % EX CREA: TOPICAL_CREAM | CUTANEOUS | Status: DC

## 2014-03-22 NOTE — Progress Notes (Signed)
C/o of white discharge with bad odor. Denies itching, burning, irritation. Wet prep done, will follow up results and manage accordingly. Occasional edema in hands and feet, patient reassured.   Patient complains of worsening rash; rash resembles diffuse urticarial plaques in legs.  Patient reports this happened during last pregnancy and this pregnancy.  Improved with triamcinolone cream, but now worsened. Clobetasol cream prescribed. If worsens, may need Dermatology referral. TOLAC consent signed Unable to do correct AFP test due to GA ; tumor AFP was done last time inadvertently. Normal anatomy, low suspicion for neural tube defects. No other complaints or concerns.  Routine obstetric precautions reviewed. Third trimester labs, Tdap next visit.

## 2014-03-22 NOTE — Patient Instructions (Addendum)
Regrese a la clinica cuando tenga su cita. Si tiene problemas o preguntas, llama a la clinica o vaya a la sala de emergencia al [facility]. Informacin sobre la prueba de Munhalltrabajo de parto despus de Neomia Dearuna cesrea (Trial of Labor After Cesarean Delivery Information) La prueba de Gainesvilletrabajo de parto despus de una cesrea (TOLAC por sus siglas en ingls) es cuando una mujer trata de dar a luz por va vaginal despus de una cesrea anterior. La TOLAC puede ser Neomia Dearuna opcin segura y [place] Islandsadecuada segn la historia clnica y otros factores de Dentonriesgo. Cuando TOLAC tiene xito y es capaz de tener un parto vaginal, esto se llama un parto vaginal despus de Neomia Dearuna cesrea (PVDC).  CANDIDATAS PARA TOLAC Este procedimiento es posible para algunas mujeres que: Hayan sido sometidas a uno o dos partos por cesrea en los que la incisin del tero fue horizontal (transversal baja). Estn esperando gemelos y tuvieron una incisin transversal baja durante una cesrea. No tienen una cicatriz uterina vertical (clsica). No han tenido una ruptura en la pared del tero (ruptura uterina). El TOLAC tambin puede intentarse en mujeres que cumplen con los criterios apropiados: Son menores de [address] aos. Son altas y tienen un ndice de masa corporal [facility] inferior a 30. . Fredderick Phenixienen una cicatriz uterina desconocida. Dan a luz en un centro equipado para realizar un parto por cesrea de emergencia. Este equipo debe ser capaz de Company secretarymanejar las posibles complicaciones, como una ruptura uterina. Tienen asesoramiento completo United Stationerssobre los beneficios y riesgos del TOLAC. Han comentado acerca de futuros planes de embarazo con su mdico. Han planificado tener varios embarazos ms. CANDIDATAS A TENER MS XITO EN TOLAC: Han tenido un parto vaginal exitoso antes o despus de su parto por cesrea. Experimentan un trabajo de parto que comienza, naturalmente, en o antes de la fecha estimada (40 semanas de gestacin). El beb no es muy grande (  macrosmico).  Han tenido un parto por cesrea anterior, pero actualmente no hay factores que promoveran un parto por cesrea (como una posicin de Mundaynalgas). Han tenido un solo parto por cesrea anteriormente. Tuvo un parto por cesrea antes de realizar el Harwoodtrabajo de parto y no despus de Teacher, English as a foreign languagela dilatacin completa. TOLAC puede ser ms apropiado para mujeres que cumplen con las normas anteriores y que planean tener ms embarazos. No se recomienda en partos domiciliarios. CANDIDATAS A TENER MENOS XITO EN TOLAC: Tienen un parto inducido con un cuello uterino desfavorable. Un cuello uterino desfavorable es cuando no se dilata lo suficiente (entre otros factores). Nunca han tenido un parto vaginal. Han tenido ms de dos partos por cesrea. Tienen un embarazo de ms de 40 semanas de gestacin. Est embarazada de un un beb con sospecha de un peso mayor de 4.000 gramos (8  libras) y no tiene antecedentes de un parto vaginal. Han tenido embarazos muy cercanos. BENEFICIOS SUGERIDOS DE LA TOLAC Tiempo de recuperacin ms rpido. Permanencia ms breve en el hospital. Menos dolor y problemas que en un parto por cesrea. Las AK Steel Holding Corporationmujeres que tienen un parto por cesrea tienen una mayor probabilidad de necesitar sangre o tener fiebre, una infeccin o un cogulo sanguneo en las piernas. RIESGOS SUGERIDOS DE LA TOLAC El riesgo ms alto de complicaciones ocurre en mujeres que intentan un TOLAC y fracasan. Una TOLAC que fracasa resulta en una cesrea no planificada. Los riesgos relacionados con TOLAC o cesreas repetidas son:  Lulu Ridingrdida de sangre. Infeccin. Cogulos sanguneos. Lesiones en los rganos o tejidos circundantes. Menor riesgo de remocin del tero (  histerectoma). Posibles problemas con la placenta (como placenta previa o placenta acreta) en embarazos futuros. Aunque es muy raro, las preocupaciones principales con el TOLAC son: Ruptura de la cicatriz uterina de una cesrea anterior. Necesidad de una  cesrea de Associate Professor. Tener un mal resultado para el beb (morbilidad perinatal). Irven Shelling MS INFORMACIN [employer] of Obstetricians and Gynecologists (Colegio Estadounidense de Obstetras y Gineclogos): www.acog.org [employer] of Nurse-Midwives (Colegio Estadounidense de Enfermeras - parteras): www.[email or web address].org Document Released: 08/02/2011 Document Revised: 06/03/2013 [facility] Patient Information 2015 [place], [place]. This information is not intended to replace advice given to you by your health care provider. Make sure you discuss any questions you have with your health care provider. Vacuna contra el ttanos y la difteria (Td): lo que debe saber (Td Vaccine (Tetanus and Diphtheria): What You Need to Know) 1. Por qu vacunarse? El ttanos y la difteria son enfermedades muy graves. Son Academic librarian frecuentes en los [place] actualmente, pero las personas que se infectan suelen tener complicaciones graves. La vacuna Td se [place] para proteger a los adolescentes y a los adultos de ambas enfermedades. Tanto el ttanos como la difteria son infecciones causadas por bacterias. La difteria se transmite de persona a persona a travs de la tos o el estornudo. La bacteria que causa el ttanos entra al cuerpo a travs de cortes, raspones o heridas. El TTANOS (trismo) provoca entumecimiento y Engineer, materials dolorosa de los msculos, por lo general, en todo el cuerpo.  Puede causar el endurecimiento de los msculos de la cabeza y el cuello, de modo que impide abrir la boca, tragar y en algunos casos, Industrial/product designer. El ttanos causa la muerte de aproximadamente 1 de cada 5 personas que se infectan. La DIFTERIA puede hacer que se forme una membrana gruesa en el fondo de la garganta.  Puede causar problemas respiratorios, parlisis, insuficiencia cardaca e incluso la muerte. Antes de las vacunas, en los [place] se vean ms de 200000 casos al ao de difteria y cientos de casos de ttanos. Desde  que comenz la vacunacin, los casos de ambas enfermedades se han reducido en un 99%. 2. Madilyn Fireman Td La vacuna Td protege a adolescentes y adultos contra el ttanos y la difteria. La vacuna Td habitualmente se aplica como dosis de refuerzo cada 10aos, pero tambin puede administrarse antes si la persona sufre una [place] o herida sucia y grave. El mdico le dar ms informacin. La Td puede administrarse de manera segura simultneamente con otras vacunas. 3. Algunas personas no deben recibir esta vacuna  Si alguna vez tuvo una reaccin alrgica potencialmente mortal despus de Neomia Dear dosis de la vacuna contra el ttanos o la difteria, O tuvo una alergia grave a cualquiera de los componentes de esta vacuna, no debe aplicarse la vacuna Td. Informe a su mdico si usted sufre algn tipo de alergia grave.  Consulte con su mdico si:  tiene epilepsia u otra enfermedad del sistema nervioso,  tuvo hinchazn o dolor intenso despus de cualquier vacuna contra la difteria o el ttanos,  alguna vez ha sufrido el sndrome de Pension scheme manager (GBS),  no se siente Research scientist (life sciences) en que se ha programado la vacuna. 4. Riesgos de [place] reaccin a la vacuna Con la vacuna, como con cualquier [employer], existe la posibilidad de sufrir efectos secundarios. Suelen ser leves y desaparecen por s solos. Los efectos secundarios graves son [place], pero son Lynnae Sandhoff raros. La [employer] de las personas a las que se les aplica la vacuna Td no tienen ningn problema.  Problemas leves despus de la vacuna Td (que no interfirieron en otras actividades)  Dolor en el lugar de la inyeccin (alrededor de 8de cada 10personas)  Enrojecimiento o hinchazn en el lugar de la inyeccin (alrededor de 1de cada 3personas)  Fiebre leve (alrededor de 1 de cada 15 personas)  Dolor de Training and development officer o cansancio (poco frecuente) Problemas moderados despus de la vacuna Td (que interfirieron en las [place], [place] no exigieron atencin  [place])  Teacher, English as a foreign language superior a 102F (poco frecuente) Problemas graves despus de la vacuna Td (que impidieron Education officer, environmental las actividades habituales; exigieron atencin mdica)  Hinchazn, dolor intenso, sangrado o enrojecimiento en el brazo donde se aplic la inyeccin (poco frecuente). Problemas que podran ocurrir despus de cualquier vacuna:  Episodios leves de desmayo pueden presentarse despus de cualquier procedimiento mdico, incluso despus de recibir [employer]. Si permanece sentado o recostado durante 15 minutos puede ayudar a [employer] y las lesiones causadas por las cadas. Informe al mdico si se siente mareado, tiene cambios en la visin o zumbidos en los odos.  Con muy poca frecuencia, despus de Cathleen Corti, puede presentarse dolor intenso en el hombro y [place] reduccin en el rango de movimiento del brazo donde se la aplic.  Las Therapist, art graves a Cathleen Corti son poco frecuentes; se estima una proporcin de menos de una en un milln de dosis. Si aparecen, generalmente es despus de algunos minutos o algunas horas de la aplicacin de la vacuna. 5. Qu pasa si hay una reaccin grave? A qu signos debo estar atento?  Observe todo lo que le preocupe, como signos de una reaccin alrgica grave, fiebre muy alta o cambios en el comportamiento. Los signos de Runner, broadcasting/film/video grave pueden incluir ronchas, hinchazn de la cara y la garganta, dificultad para respirar, latidos cardacos acelerados, mareos y debilidad. Generalmente, estos comenzaran entre unos pocos minutos y algunas horas despus de la vacunacin. Qu debo hacer?  Si usted piensa que se trata de una reaccin alrgica grave o de otra emergencia que no puede esperar, llame al 911 o dirjase al hospital ms cercano. Sino, llame a su mdico.  Despus, la reaccin debe informarse al [address] de Informacin sobre Efectos Adversos de las [place] (Vaccine Adverse Event Reporting System, VAERS). Su mdico  puede presentar este informe, o puede hacerlo usted mismo a travs del sitio web de VAERS, en www.vaers.LAgents.no, o llamando al [phone]. El VAERS es solo para Biomedical engineer. No brindan consejo mdico. 6. Programa Nacional de Compensacin de Daos por [employer] [place] de Compensacin de Daos por Administrator, arts (National Vaccine Injury Compensation Program, VICP) es un programa federal que fue creado para Patent examiner a las personas que puedan haber sufrido daos al recibir ciertas vacunas. Aquellas personas que consideren que han sufrido un dao como consecuencia de una vacuna y quieren saber ms acerca del programa y cmo presentar Roslynn Amble, pueden llamar al ([phone] o visitar su sitio web en SpiritualWord.at. 7. Cmo puedo obtener ms informacin?  Consulte a su mdico.  Comunquese con el servicio de salud de su localidad o [address].  Comunquese con los Centros para Air traffic controller y la Prevencin de Child psychotherapist for Disease Control and Prevention , CDC).  Llame al [phone] (1-800-CDC-INFO).  Visite la pgina Environmental manager en [email or web address] Declaracin de informacin sobre la vacuna contra el ttanos y la difteria (Td) (provisional) de los CDC (09/30/12) Document Released: 11/29/2008 Document Revised: 12/28/2013 ExitCare Patient Information 2015 [place], [place]. This information is not intended  to replace advice given to you by your health care provider. Make sure you discuss any questions you have with your health care provider.  

## 2014-03-23 LAB — WET PREP, GENITAL
CLUE CELLS WET PREP: NONE SEEN
TRICH WET PREP: NONE SEEN
WBC, Wet Prep HPF POC: NONE SEEN
Yeast Wet Prep HPF POC: NONE SEEN

## 2014-03-23 MED ORDER — METRONIDAZOLE 500 MG PO TABS: 500.0000 mg | ORAL_TABLET | Freq: Two times a day (BID) | ORAL | Status: DC

## 2014-03-23 NOTE — Progress Notes (Signed)
Wet prep showed few clue cells concerning for BV. Metronidazole prescribed. Patient will be called and advised to pick up prescription.

## 2014-03-23 NOTE — Addendum Note (Signed)
Addended by: Jaynie CollinsANYANWU, Kerby Hockley A on: 03/23/2014 10:08 AM   Modules accepted: Orders

## 2014-03-25 ENCOUNTER — Encounter: Payer: Self-pay | Admitting: *Deleted

## 2014-04-19 ENCOUNTER — Encounter: Payer: Self-pay | Admitting: Obstetrics and Gynecology

## 2014-05-10 ENCOUNTER — Encounter: Payer: Self-pay | Admitting: Obstetrics & Gynecology

## 2014-05-10 ENCOUNTER — Ambulatory Visit (INDEPENDENT_AMBULATORY_CARE_PROVIDER_SITE_OTHER): Payer: Self-pay | Admitting: Obstetrics & Gynecology

## 2014-05-10 VITALS — BP 109/62 | HR 78 | Temp 98.4°F | Wt 160.0 lb

## 2014-05-10 DIAGNOSIS — O3433 Maternal care for cervical incompetence, third trimester: Secondary | ICD-10-CM

## 2014-05-10 DIAGNOSIS — O0990 Supervision of high risk pregnancy, unspecified, unspecified trimester: Secondary | ICD-10-CM

## 2014-05-10 DIAGNOSIS — L259 Unspecified contact dermatitis, unspecified cause: Secondary | ICD-10-CM

## 2014-05-10 DIAGNOSIS — L309 Dermatitis, unspecified: Secondary | ICD-10-CM

## 2014-05-10 DIAGNOSIS — Z3493 Encounter for supervision of normal pregnancy, unspecified, third trimester: Secondary | ICD-10-CM

## 2014-05-10 DIAGNOSIS — O99712 Diseases of the skin and subcutaneous tissue complicating pregnancy, second trimester: Secondary | ICD-10-CM

## 2014-05-10 DIAGNOSIS — O99891 Other specified diseases and conditions complicating pregnancy: Secondary | ICD-10-CM

## 2014-05-10 DIAGNOSIS — O9989 Other specified diseases and conditions complicating pregnancy, childbirth and the puerperium: Secondary | ICD-10-CM

## 2014-05-10 DIAGNOSIS — O3430 Maternal care for cervical incompetence, unspecified trimester: Secondary | ICD-10-CM

## 2014-05-10 DIAGNOSIS — O34219 Maternal care for unspecified type scar from previous cesarean delivery: Secondary | ICD-10-CM

## 2014-05-10 DIAGNOSIS — Z23 Encounter for immunization: Secondary | ICD-10-CM

## 2014-05-10 LAB — POCT URINALYSIS DIP (DEVICE)
Bilirubin Urine: NEGATIVE
Glucose, UA: NEGATIVE mg/dL
Hgb urine dipstick: NEGATIVE
Ketones, ur: NEGATIVE mg/dL
Leukocytes, UA: NEGATIVE
Nitrite: NEGATIVE
PH: 5.5 (ref 5.0–8.0)
PROTEIN: NEGATIVE mg/dL
SPECIFIC GRAVITY, URINE: 1.02 (ref 1.005–1.030)
UROBILINOGEN UA: 0.2 mg/dL (ref 0.0–1.0)

## 2014-05-10 MED ORDER — TETANUS-DIPHTH-ACELL PERTUSSIS 5-2.5-18.5 LF-MCG/0.5 IM SUSP: 0.5000 mL | Freq: Once | INTRAMUSCULAR | Status: DC

## 2014-05-10 MED ORDER — CLOBETASOL PROP EMOLLIENT BASE 0.05 % EX CREA: TOPICAL_CREAM | CUTANEOUS | Status: DC

## 2014-05-10 NOTE — Progress Notes (Signed)
Patient is Spanish-speaking only, Spanish interpreter present for this encounter. Third trimester labs today.  Tdap and Flu vaccines today.  No other complaints or concerns.  Labor and fetal movement precautions reviewed.

## 2014-05-10 NOTE — Progress Notes (Signed)
1hr due at 950 Patient would like tdap and flu vaccines.

## 2014-05-10 NOTE — Patient Instructions (Signed)
Return to clinic for any obstetric concerns or go to [facility] for evaluation  

## 2014-05-11 LAB — GLUCOSE TOLERANCE, 1 HOUR (50G) W/O FASTING: GLUCOSE 1 HOUR GTT: 105

## 2014-05-11 LAB — OB RESULTS CONSOLE RPR: RPR: NONREACTIVE

## 2014-05-11 LAB — OB RESULTS CONSOLE PLATELET COUNT: Platelets: 190 10*3/uL

## 2014-05-11 LAB — OB RESULTS CONSOLE HGB/HCT, BLOOD
HCT: 30 %
Hemoglobin: 10.3 g/dL

## 2014-05-11 LAB — OB RESULTS CONSOLE HIV ANTIBODY (ROUTINE TESTING): HIV: NONREACTIVE

## 2014-05-13 ENCOUNTER — Encounter (HOSPITAL_COMMUNITY): Payer: Self-pay | Admitting: *Deleted

## 2014-05-13 ENCOUNTER — Inpatient Hospital Stay (HOSPITAL_COMMUNITY)
Admission: AD | Admit: 2014-05-13 | Discharge: 2014-05-13 | Disposition: A | Discharge status: Home / Self Care | Payer: Self-pay | Source: Ambulatory Visit | Attending: Obstetrics & Gynecology | Admitting: Obstetrics & Gynecology

## 2014-05-13 DIAGNOSIS — O4700 False labor before 37 completed weeks of gestation, unspecified trimester: Secondary | ICD-10-CM | POA: Insufficient documentation

## 2014-05-13 DIAGNOSIS — O99891 Other specified diseases and conditions complicating pregnancy: Secondary | ICD-10-CM

## 2014-05-13 DIAGNOSIS — M549 Dorsalgia, unspecified: Secondary | ICD-10-CM

## 2014-05-13 DIAGNOSIS — O9989 Other specified diseases and conditions complicating pregnancy, childbirth and the puerperium: Secondary | ICD-10-CM

## 2014-05-13 LAB — URINALYSIS, ROUTINE W REFLEX MICROSCOPIC
BILIRUBIN URINE: NEGATIVE
GLUCOSE, UA: NEGATIVE mg/dL
HGB URINE DIPSTICK: NEGATIVE
KETONES UR: NEGATIVE mg/dL
Leukocytes, UA: NEGATIVE
Nitrite: NEGATIVE
Protein, ur: NEGATIVE mg/dL
Specific Gravity, Urine: 1.02 (ref 1.005–1.030)
Urobilinogen, UA: 1 mg/dL (ref 0.0–1.0)
pH: 7 (ref 5.0–8.0)

## 2014-05-13 LAB — FETAL FIBRONECTIN: FETAL FIBRONECTIN: NEGATIVE

## 2014-05-13 NOTE — [facility] Provider Note (Signed)
None     Chief Complaint:    Amy [name] is  33 y.o. [id number] at [redacted]w[redacted]d presents complaining of lower back pain since yesterday and cramping off and on for a month. Has a prophylactic cerclage d/t hx incompetent cx.  Obstetrical/Gynecological History: OB History   Grav Para Term Preterm Abortions TAB SAB Ect Mult Living   0 Past Medical History: Past Medical History  Diagnosis Date  . Medical history non-contributory     Past Surgical History: Past Surgical History  Procedure Laterality Date  . Cesarean section      one previous  . Cervical cerclage N/A 01/13/2014    Procedure: CERCLAGE CERVICAL;  Surgeon: Adam Phenix, MD;  Location: [facility];  Service: Gynecology;  Laterality: N/A;    Family History: Family History  Problem Relation Age of Onset  . Diabetes Father   . Diabetes Sister     Social History: History  Substance Use Topics  . Smoking status: Never Smoker   . Smokeless tobacco: Not on file  . Alcohol Use: No    Allergies:  Allergies  Allergen Reactions  . Penicillins Rash    Meds:  Facility-administered medications prior to admission  Medication Dose Route Frequency Provider Last Rate Last Dose  . Tdap (BOOSTRIX) injection 0.5 mL  0.5 mL Intramuscular Once Tereso Newcomer, MD       Prescriptions prior to admission  Medication Sig Dispense Refill  . Clobetasol Prop Emollient Base 0.05 % emollient cream Apply to affected area  30 g  3  . metoCLOPramide (REGLAN) 10 MG tablet Take 1 tablet (10 mg total) by mouth 3 (three) times daily with meals.  90 tablet  1  . metroNIDAZOLE (FLAGYL) 500 MG tablet Take 1 tablet (500 mg total) by mouth 2 (two) times daily.  14 tablet  2  . Prenatal Vit-Fe Fumarate-FA (PRENATAL VITAMINS PLUS) 27-1 MG TABS Take 1 tablet by mouth daily.      . Triamcinolone Acetonide (TRIAMCINOLONE 0.1 % CREAM : EUCERIN) CREA Apply 1 application topically 2 (two) times daily.  1 each  1    Review of  Systems   Constitutional: Negative for fever and chills Eyes: Negative for visual disturbances Respiratory: Negative for shortness of breath, dyspnea Cardiovascular: Negative for chest pain or palpitations  Gastrointestinal: Negative for vomiting, diarrhea and constipation Genitourinary: Negative for dysuria and urgency Musculoskeletal: Negative for back pain, joint pain, myalgias  Neurological: Negative for dizziness and headaches     Physical Exam  Blood pressure 101/58, pulse 96, temperature 98.2 F (36.8 C), temperature source Oral, resp. rate 16, last menstrual period 10/12/2013. GENERAL: Well-developed, well-nourished female in no acute distress.  LUNGS: Clear to auscultation bilaterally.  HEART: Regular rate and rhythm. ABDOMEN: Soft, nontender, nondistended, gravid.  EXTREMITIES: Nontender, no edema, 2+ distal pulses. DTR's 2+ CERVICAL EXAM: Dilatation FT   Effacement 0%   Station out of pelvis   Presentation: undeterminable  Cerclage in place FHT:  Baseline rate 140 bpm   Variability moderate  Accelerations present   Decelerations none Contractions: none   Labs: Results for orders placed during the hospital encounter of 05/13/14 (from the past 24 hour(s))  FETAL FIBRONECTIN   Collection Time    05/13/14  5:50 PM      Result Value Ref Range   Fetal Fibronectin NEGATIVE  NEGATIVE  URINALYSIS, ROUTINE W REFLEX MICROSCOPIC   Collection Time  05/13/14  6:18 PM      Result Value Ref Range   Color, Urine YELLOW  YELLOW   APPearance CLEAR  CLEAR   Specific Gravity, Urine 1.020  1.005 - 1.030   pH 7.0  5.0 - 8.0   Glucose, UA NEGATIVE  NEGATIVE mg/dL   Hgb urine dipstick NEGATIVE  NEGATIVE   Bilirubin Urine NEGATIVE  NEGATIVE   Ketones, ur NEGATIVE  NEGATIVE mg/dL   Protein, ur NEGATIVE  NEGATIVE mg/dL   Urobilinogen, UA 1.0  0.0 - 1.0 mg/dL   Nitrite NEGATIVE  NEGATIVE   Leukocytes, UA NEGATIVE  NEGATIVE   Imaging Studies:  No results  found.  Assessment: Amy [name] is  33 y.o. [id number] at [redacted]w[redacted]d presents with back pain, [name].  Plan: Discussed relief measures.   CRESENZO-DISHMAN,Cydni Reddoch 9/17/20158:42 PM

## 2014-05-13 NOTE — MAU Note (Signed)
FFN collected without speculum

## 2014-05-13 NOTE — MAU Note (Signed)
Pt states she has been having cramping x1 month. Pt states it is really bad

## 2014-05-13 NOTE — Discharge Instructions (Signed)
Ejercicios para la espalda (Back Exercises) Estos ejercicios ayudan a tratar y prevenir lesiones en la espalda. El objetivo es aumentar la fuerza de los msculos abdominales y dorsales y la flexibilidad de la espalda. Debe comenzar con estos ejercicios cuando ya no tenga dolor. Los ejercicios para la espalda incluyen:  Inclinacin de la pelvis - Recustese sobre la espalda con las rodillas flexionadas. Incline la pelvis hasta que la parte inferior de la espalda se apoye en el piso. Mantenga esta posicin durante 5 a 10 segundos y repita entre 5 y 10 veces.  Rodilla al pecho - Empuje primero una rodilla contra el pecho y [place] 20 a 30 segundos; repita con la otra rodilla y luego con ambas a la vez. Esto puede realizarlo con la otra pierna extendida o flexionada, del modo en que se sienta ms cmodo.  Abdominales o despegar el cccix del suelo empleando la musculatura abdominal - Flexione las rodillas 90 grados. Comience inclinando la pelvis y realice un ejercicio abdominal lento y parcial, elevando el tronco slo entre 30 y 45 grados del suelo. Emplee al [employer] 2 y 3 segundos para cada abdominal. No realice los abdominales con las rodillas extendidas. Si le resulta difcil realizar abdominales parciales, simplemente haga lo que se explic anteriormente, pero slo contraiga los msculos abdominales y Buyer, retail tal como se le ha indicado.  Inclinacin de la cadera - Recustese sobre la espalda con las rodillas flexionadas a 90 grados. Empjese con los pies y los hombros mientras eleva la cadera un par de centmetros del suelo, [place] durante 10 segundos y repita entre 5 y 10 veces.  Arcos dorsales - Acustese sobre el [place] e impulse el tronco hacia atrs sobre los codos flexionados. Presione lentamente con las manos, formando un arco con la zona inferior de la espalda. Repita entre 3 y 5 veces. Al realizar las repeticiones, luego de un tiempo disminuirn la rigidez y las  [place].  Elevacin de los hombros - Acustese hacia abajo con los brazos a los lados del cuerpo. Presione las caderas y Dance movement psychotherapist torso contra el suelo mientras eleva lentamente la cabeza y los hombros del suelo. No exagere con los ejercicios, especialmente en el comienzo. Los ejercicios pueden causar alguna molestia leve en la espalda durante algunos minutos; sin embargo, si el dolor es muy intenso, o dura ms de 15 minutos, no siga con la actividad fsica hasta que consulte al profesional que lo asiste. Los problemas en la espalda mejoran de [place] lenta con esta terapia.  Consulte al profesional para que lo ayude a planificar un programa de ejercicios adecuado para su espalda. Document Released: 08/13/2005 Document Revised: 11/05/2011 [facility] Patient Information 2015 [place], [place]. This information is not intended to replace advice given to you by your health care provider. Make sure you discuss any questions you have with your health care provider.  Dolor abdominal en el embarazo (Abdominal Pain During Pregnancy) El dolor abdominal es frecuente durante el embarazo. Generalmente no causa ningn dao. El dolor abdominal puede tener numerosas causas. Algunas causas son ms graves que otras. Ciertas causas de dolor abdominal durante el embarazo se diagnostican fcilmente. A veces, se tarda un tiempo para llegar al diagnstico. Otras veces la causa no se conoce. El dolor abdominal puede estar relacionado con [place] alteracin del [place], o puede deberse a una causa totalmente diferente. Por este motivo, siempre consulte a su mdico cuando sienta molestias abdominales. INSTRUCCIONES PARA EL CUIDADO EN EL HOGAR  Est atenta al dolor para ver si  hay cambios. Las siguientes indicaciones ayudarn a Psychologist, educational [employer] pueda sentir:  No Chiropodist sexuales y no coloque nada dentro de la vagina hasta que los sntomas hayan desaparecido completamente.  Descanse todo lo que pueda [employer]  dolor se le haya calmado.  Si siente nuseas, beba lquidos claros. Evite los alimentos slidos mientras sienta malestar o tenga nuseas.  Tome slo medicamentos de venta libre o recetados, segn las indicaciones del mdico.  Cumpla con todas las visitas de control, segn le indique su mdico. SOLICITE ATENCIN MDICA DE INMEDIATO SI:  Tiene un sangrado, prdida de lquidos o elimina tejidos por la vagina.  El dolor o los clicos [place].  Tiene vmitos persistentes.  Comienza a Financial risk analyst al orinar u [employer].  Tiene fiebre.  Nota que los movimientos del beb disminuyen.  Siente intensa debilidad o se marea.  Tiene dificultad para respirar con o sin dolor abdominal.  Siente un dolor de cabeza intenso junto al dolor abdominal.  [place] secrecin vaginal anormal con dolor abdominal.  Tiene diarrea persistente.  El dolor abdominal sigue o empeora an despus de Field seismologist. ASEGRESE DE QUE:   Comprende estas instrucciones.  Controlar su afeccin.  Recibir ayuda de inmediato si no mejora o si empeora. Document Released: 08/13/2005 Document Revised: 06/03/2013 [facility] Patient Information 2015 [place], [place]. This information is not intended to replace advice given to you by your health care provider. Make sure you discuss any questions you have with your health care provider.

## 2014-05-13 NOTE — MAU Provider Note (Signed)
Attestation of Attending Supervision of Advanced Practitioner (CNM/NP): Evaluation and management procedures were performed by the Advanced Practitioner under my supervision and collaboration.  I have reviewed the Advanced Practitioner's note and chart, and I agree with the management and plan.  HARRAWAY-SMITH, Chassie Pennix 9:30 PM     

## 2014-05-24 ENCOUNTER — Encounter: Payer: Self-pay | Admitting: Obstetrics and Gynecology

## 2014-05-31 ENCOUNTER — Ambulatory Visit (INDEPENDENT_AMBULATORY_CARE_PROVIDER_SITE_OTHER): Payer: Self-pay | Admitting: Family Medicine

## 2014-05-31 ENCOUNTER — Encounter: Payer: Self-pay | Admitting: Family Medicine

## 2014-05-31 VITALS — BP 96/57 | HR 84 | Temp 97.8°F | Wt 161.2 lb

## 2014-05-31 DIAGNOSIS — Z3493 Encounter for supervision of normal pregnancy, unspecified, third trimester: Secondary | ICD-10-CM

## 2014-05-31 LAB — POCT URINALYSIS DIP (DEVICE)
Bilirubin Urine: NEGATIVE
Glucose, UA: NEGATIVE mg/dL
Ketones, ur: NEGATIVE mg/dL
Leukocytes, UA: NEGATIVE
Nitrite: NEGATIVE
Protein, ur: NEGATIVE mg/dL
Specific Gravity, Urine: 1.02 (ref 1.005–1.030)
Urobilinogen, UA: 0.2 mg/dL (ref 0.0–1.0)
pH: 6 (ref 5.0–8.0)

## 2014-05-31 NOTE — Patient Instructions (Signed)
Tercer trimestre de embarazo (Third Trimester of Pregnancy) El tercer trimestre va desde la semana29 hasta la 42, desde el sptimo hasta el noveno mes, y es la poca en la que el feto crece ms rpidamente. Hacia el final del noveno mes, el feto mide alrededor de 20pulgadas (45cm) de largo y pesa entre 6 y 10 libras (2,700 y 4,500kg).  CAMBIOS EN EL ORGANISMO Su organismo atraviesa por muchos cambios durante el embarazo, y estos varan de una mujer a otra.   Seguir aumentando de peso. Es de esperar que aumente entre 25 y 35libras (11 y 16kg) hacia el final del embarazo.  Podrn aparecer las primeras estras en las caderas, el abdomen y las mamas.  Puede tener necesidad de orinar con ms frecuencia porque el feto baja hacia la pelvis y ejerce presin sobre la vejiga.  Debido al embarazo podr sentir acidez estomacal con frecuencia.  Puede estar estreida, ya que ciertas hormonas enlentecen los movimientos de los msculos que empujan los desechos a travs de los intestinos.  Pueden aparecer hemorroides o abultarse e hincharse las venas (venas varicosas).  Puede sentir dolor plvico debido al aumento de peso y a que las hormonas del embarazo relajan las articulaciones entre los huesos de la pelvis. El dolor de espalda puede ser consecuencia de la sobrecarga de los msculos que soportan la postura.  Tal vez haya cambios en el cabello que pueden incluir su engrosamiento, crecimiento rpido y cambios en la textura. Adems, a algunas mujeres se les cae el cabello durante o despus del embarazo, o tienen el cabello seco o fino. Lo ms probable es que el cabello se le normalice despus del nacimiento del beb.  Las mamas seguirn creciendo y le dolern. A veces, puede haber una secrecin amarilla de las mamas llamada calostro.  El ombligo puede salir hacia afuera.  Puede sentir que le falta el aire debido a que se expande el tero.  Puede notar que el feto "baja" o lo siente ms bajo, en el  abdomen.  Puede tener una prdida de secrecin mucosa con sangre. Esto suele ocurrir en el trmino de unos pocos das a una semana antes de que comience el trabajo de parto.  El cuello del tero se vuelve delgado y blando (se borra) cerca de la fecha de parto. QU DEBE ESPERAR EN LOS EXMENES PRENATALES  Le harn exmenes prenatales cada 2semanas hasta la semana36. A partir de ese momento le harn exmenes semanales. Durante una visita prenatal de rutina:  La pesarn para asegurarse de que usted y el feto estn creciendo normalmente.  Le tomarn la presin arterial.  Le medirn el abdomen para controlar el desarrollo del beb.  Se escucharn los latidos cardacos fetales.  Se evaluarn los resultados de los estudios solicitados en visitas anteriores.  Le revisarn el cuello del tero cuando est prxima la fecha de parto para controlar si este se ha borrado. Alrededor de la semana36, el mdico le revisar el cuello del tero. Al mismo tiempo, realizar un anlisis de las secreciones del tejido vaginal. Este examen es para determinar si hay un tipo de bacteria, estreptococo Grupo B. El mdico le explicar esto con ms detalle. El mdico puede preguntarle lo siguiente:  Cmo le gustara que fuera el parto.  Cmo se siente.  Si siente los movimientos del beb.  Si ha tenido sntomas anormales, como prdida de lquido, sangrado, dolores de cabeza intensos o clicos abdominales.  Si tiene alguna pregunta. Otros exmenes o estudios de deteccin que pueden realizarse   durante el tercer trimestre incluyen lo siguiente:  Anlisis de sangre para controlar las concentraciones de hierro (anemia).  Controles fetales para determinar su salud, nivel de actividad y crecimiento. Si tiene alguna enfermedad o hay problemas durante el embarazo, le harn estudios. FALSO TRABAJO DE PARTO Es posible que sienta contracciones leves e irregulares que finalmente desaparecen. Se llaman contracciones de  Braxton Hicks o falso trabajo de parto. Las contracciones pueden durar horas, das o incluso semanas, antes de que el verdadero trabajo de parto se inicie. Si las contracciones ocurren a intervalos regulares, se intensifican o se hacen dolorosas, lo mejor es que la revise el mdico.  SIGNOS DE TRABAJO DE PARTO   Clicos de tipo menstrual.  Contracciones cada 5minutos o menos.  Contracciones que comienzan en la parte superior del tero y se extienden hacia abajo, a la zona inferior del abdomen y la espalda.  Sensacin de mayor presin en la pelvis o dolor de espalda.  Una secrecin de mucosidad acuosa o con sangre que sale de la vagina. Si tiene alguno de estos signos antes de la semana37 del embarazo, llame a su mdico de inmediato. Debe concurrir al hospital para que la controlen inmediatamente. INSTRUCCIONES PARA EL CUIDADO EN EL HOGAR   Evite fumar, consumir hierbas, beber alcohol y tomar frmacos que no le hayan recetado. Estas sustancias qumicas afectan la formacin y el desarrollo del beb.  Siga las indicaciones del mdico en relacin con el uso de medicamentos. Durante el embarazo, hay medicamentos que son seguros de tomar y otros que no.  Haga actividad fsica solo en la forma indicada por el mdico. Sentir clicos uterinos es un buen signo para detener la actividad fsica.  Contine comiendo alimentos que sanos con regularidad.  Use un sostn que le brinde buen soporte si le duelen las mamas.  No se d baos de inmersin en agua caliente, baos turcos ni saunas.  Colquese el cinturn de seguridad cuando conduzca.  No coma carne cruda ni queso sin cocinar; evite el contacto con las bandejas sanitarias de los gatos y la tierra que estos animales usan. Estos elementos contienen grmenes que pueden causar defectos congnitos en el beb.  Tome las vitaminas prenatales.  Si est estreida, pruebe un laxante suave (si el mdico lo autoriza). Consuma ms alimentos ricos en  fibra, como vegetales y frutas frescos y cereales integrales. Beba gran cantidad de lquido para mantener la orina de tono claro o color amarillo plido.  Dese baos de asiento con agua tibia para aliviar el dolor o las molestias causadas por las hemorroides. Use una crema para las hemorroides si el mdico la autoriza.  Si tiene venas varicosas, use medias de descanso. Eleve los pies durante 15minutos, 3 o 4veces por da. Limite la cantidad de sal en su dieta.  Evite levantar objetos pesados, use zapatos de tacones bajos y mantenga una buena postura.  Descanse con las piernas elevadas si tiene calambres o dolor de cintura.  Visite a su dentista si no lo ha hecho durante el embarazo. Use un cepillo de dientes blando para higienizarse los dientes y psese el hilo dental con suavidad.  Puede seguir manteniendo relaciones sexuales, a menos que el mdico le indique lo contrario.  No haga viajes largos excepto que sea absolutamente necesario y solo con la autorizacin del mdico.  Tome clases prenatales para entender, practicar y hacer preguntas sobre el trabajo de parto y el parto.  Haga un ensayo de la partida al hospital.  Prepare el bolso que   llevar al hospital.  Prepare la habitacin del beb.  Concurra a todas las visitas prenatales segn las indicaciones de su mdico. SOLICITE ATENCIN MDICA SI:  No est segura de que est en trabajo de parto o de que ha roto la bolsa de las aguas.  Tiene mareos.  Siente clicos leves, presin en la pelvis o dolor persistente en el abdomen.  Tiene nuseas, vmitos o diarrea persistentes.  Tiene secrecin vaginal con mal olor.  Siente dolor al orinar. SOLICITE ATENCIN MDICA DE INMEDIATO SI:   Tiene fiebre.  Tiene una prdida de lquido por la vagina.  Tiene sangrado o pequeas prdidas vaginales.  Siente dolor intenso o clicos en el abdomen.  Sube o baja de peso rpidamente.  Tiene dificultad para respirar y siente dolor de  pecho.  Sbitamente se le hinchan mucho el rostro, las manos, los tobillos, los pies o las piernas.  No ha sentido los movimientos del beb durante una hora.  Siente un dolor de cabeza intenso que no se alivia con medicamentos.  Hay cambios en la visin. Document Released: 05/23/2005 Document Revised: 08/18/2013 [employer] Patient Information 2015 [employer]. This information is not intended to replace advice given to you by your health care provider. Make sure you discuss any questions you have with your health care provider.  

## 2014-05-31 NOTE — Progress Notes (Signed)
Interpreter Maretta LosBlanca Lindner used during this encounter.  Reports occasional edema in hands and feet.  Patient's labs from last visit not resulted-- Amy OddiBetsy, lab tech, called [facility]-- labs faxed over. To be scanned into chart.

## 2014-05-31 NOTE — Progress Notes (Signed)
Denies bleeding, spotting, ctxs.  Good fetal movement.

## 2014-06-09 ENCOUNTER — Encounter: Payer: Self-pay | Admitting: *Deleted

## 2014-06-14 ENCOUNTER — Ambulatory Visit (INDEPENDENT_AMBULATORY_CARE_PROVIDER_SITE_OTHER): Payer: Self-pay | Admitting: Obstetrics and Gynecology

## 2014-06-14 VITALS — BP 111/59 | HR 100 | Temp 97.6°F | Wt 166.7 lb

## 2014-06-14 DIAGNOSIS — N898 Other specified noninflammatory disorders of vagina: Secondary | ICD-10-CM

## 2014-06-14 DIAGNOSIS — O3433 Maternal care for cervical incompetence, third trimester: Secondary | ICD-10-CM

## 2014-06-14 DIAGNOSIS — R896 Abnormal cytological findings in specimens from other organs, systems and tissues: Secondary | ICD-10-CM

## 2014-06-14 DIAGNOSIS — Z3493 Encounter for supervision of normal pregnancy, unspecified, third trimester: Secondary | ICD-10-CM

## 2014-06-14 DIAGNOSIS — O3421 Maternal care for scar from previous cesarean delivery: Secondary | ICD-10-CM

## 2014-06-14 DIAGNOSIS — IMO0002 Reserved for concepts with insufficient information to code with codable children: Secondary | ICD-10-CM

## 2014-06-14 DIAGNOSIS — O34219 Maternal care for unspecified type scar from previous cesarean delivery: Secondary | ICD-10-CM

## 2014-06-14 LAB — WET PREP, GENITAL
TRICH WET PREP: NONE SEEN
YEAST WET PREP: NONE SEEN

## 2014-06-14 LAB — POCT URINALYSIS DIP (DEVICE)
Bilirubin Urine: NEGATIVE
GLUCOSE, UA: NEGATIVE mg/dL
Hgb urine dipstick: NEGATIVE
Ketones, ur: NEGATIVE mg/dL
Leukocytes, UA: NEGATIVE
NITRITE: NEGATIVE
Protein, ur: NEGATIVE mg/dL
Specific Gravity, Urine: 1.025 (ref 1.005–1.030)
UROBILINOGEN UA: 0.2 mg/dL (ref 0.0–1.0)
pH: 5.5 (ref 5.0–8.0)

## 2014-06-14 NOTE — Progress Notes (Signed)
Amy [name] used as interpreter for this encounter.  Reports white vaginal discharge causing vaginal irritation-- would like to be checked.  Occasional edema in hands and feet especially int he morning.

## 2014-06-14 NOTE — Progress Notes (Signed)
Doing well today. C/o white discharge and vaginal itching.  1. Cerclage in place. Needs removed at 36-37 weeks.  2. 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 Needs colpo postpartum.  3. Vaginal discharge. Wet prep sent.  4. Routine PNC. Reviewed chart. Will need 36 week labs at next visit.

## 2014-06-21 ENCOUNTER — Other Ambulatory Visit: Payer: Self-pay | Admitting: Family Medicine

## 2014-06-21 ENCOUNTER — Ambulatory Visit (INDEPENDENT_AMBULATORY_CARE_PROVIDER_SITE_OTHER): Payer: Self-pay | Admitting: Family Medicine

## 2014-06-21 VITALS — BP 102/61 | HR 90 | Temp 97.7°F | Wt 169.5 lb

## 2014-06-21 DIAGNOSIS — Z3493 Encounter for supervision of normal pregnancy, unspecified, third trimester: Secondary | ICD-10-CM

## 2014-06-21 DIAGNOSIS — O3433 Maternal care for cervical incompetence, third trimester: Secondary | ICD-10-CM

## 2014-06-21 LAB — POCT URINALYSIS DIP (DEVICE)
BILIRUBIN URINE: NEGATIVE
GLUCOSE, UA: NEGATIVE mg/dL
Hgb urine dipstick: NEGATIVE
Ketones, ur: NEGATIVE mg/dL
NITRITE: NEGATIVE
Protein, ur: NEGATIVE mg/dL
Specific Gravity, Urine: 1.02 (ref 1.005–1.030)
Urobilinogen, UA: 0.2 mg/dL (ref 0.0–1.0)
pH: 6 (ref 5.0–8.0)

## 2014-06-21 LAB — OB RESULTS CONSOLE GC/CHLAMYDIA
Chlamydia: NEGATIVE
Gonorrhea: NEGATIVE

## 2014-06-21 LAB — OB RESULTS CONSOLE GBS: GBS: NEGATIVE

## 2014-06-21 NOTE — Patient Instructions (Signed)
Tercer trimestre del embarazo  (Third Trimester of Pregnancy)  El tercer trimestre del embarazo abarca desde la semana 29 hasta la semana 42, desde el 7 mes hasta el 9. En este trimestre el beb (feto) se desarrolla muy rpidamente. Hacia el final del noveno mes, el beb que an no ha nacido mide alrededor de 20 pulgadas (45 cm) de largo. Y pesa entre 6 y 10 libras (2,700 y 4,500 kg).  CUIDADOS EN EL HOGAR   Evite fumar, consumir hierbas y beber alcohol. Evite los frmacos que no apruebe el mdico.  Slo tome los medicamentos que le haya indicado su mdico. Algunos medicamentos son seguros para tomar durante el embarazo y otros no lo son.  Haga ejercicios slo como le indique el mdico. Deje de hacer ejercicios si comienza a tener clicos.  Haga comidas regulares y sanas.  Use un sostn que le brinde buen soporte si sus mamas estn sensibles.  No utilice la baera con agua caliente, baos turcos y saunas.  Colquese el cinturn de seguridad cuando conduzca.  Evite comer carne cruda y el contacto con los utensilios y desperdicios de los gatos.  Tome las vitaminas indicadas para la etapa prenatal.  Trate de tomar medicamentos para mover el intestino (laxantes) segn lo necesario y si su mdico la autoriza. Consuma ms fibra comiendo frutas y vegetales frescos y granos enteros. Beba gran cantidad de lquido para mantener el pis (orina) de tono claro o amarillo plido.  Tome baos de agua tibia (baos de asiento) para calmar el dolor o las molestias causadas por las hemorroides. Use una crema para las hemorroides si el mdico la autoriza.  Si tiene venas hinchadas y abultadas (venas varicosas), use medias de soporte. Eleve (levante) los pies durante 15 minutos, 3 o 4 veces por da. Limite el consumo de sal en su dieta.  Evite levantar objetos pesados, usar tacones altos y sintese derecha.  Descanse con las piernas elevadas si tiene calambres o dolor de cintura.  Visite a su dentista  si no lo ha hecho durante el embarazo. Use un cepillo de dientes blando para higienizarse los dientes. Use suavemente el hilo dental.  Puede tener sexo (relaciones sexuales) siempre que el mdico la autorice.  No viaje por largas distancias si puede evitarlo. Slo hgalo con la aprobacin de su mdico.  Haga el curso pre parto.  Practique conducir hasta el hospital.  Prepare el bolso que llevar.  Prepare la habitacin del beb.  Concurra a los controles mdicos. SOLICITE AYUDA SI:   No est segura si est en trabajo de parto o ha roto la bolsa de aguas.  Tiene mareos.  Siente clicos intensos o presin en la zona baja del vientre (abdomen).  Siente un dolor persistente en la zona del vientre.  Tiene malestar estomacal (nuseas), devuelve (vomita), o tiene deposiciones acuosas (diarrea).  Advierte un olor ftido que proviene de la vagina.  Siente dolor al hacer pis (orinar). SOLICITE AYUDA DE INMEDIATO SI:   Tiene fiebre.  Pierde lquido o sangre por la vagina.  Tiene sangrando o pequeas prdidas vaginales.  Siente dolor intenso o clicos en el abdomen.  Sube o baja de peso rpidamente.  Tiene dificultad para respirar o siente dolor en el pecho.  Sbitamente se le hinchan el rostro, las manos, los tobillos, los pies o las piernas.  No ha sentido los movimientos del beb durante una hora.  Siente un dolor de cabeza intenso que no se alivia con medicamentos.  Su visin se modifica. Document   Released: 04/15/2013 Exit[employer] Patient Information 2015 [employer]. This information is not intended to replace advice given to you by your health care provider. Make sure you discuss any questions you have with your health care provider.  

## 2014-06-21 NOTE — Addendum Note (Signed)
Addended by: Amaury Kuzel E on: 06/21/2014 01:38 PM   Modules accepted: Orders  

## 2014-06-21 NOTE — Progress Notes (Signed)
Amy PerkingMaria [name] present for interpreter; Patient reports pelvic pressure and a d/c with slight itch

## 2014-06-21 NOTE — Progress Notes (Signed)
Cerclage removed.  GBC, GC/CT obtained.  Labor precautions given.

## 2014-06-22 LAB — GC/CHLAMYDIA PROBE AMP
CT PROBE, AMP APTIMA: NEGATIVE
GC PROBE AMP APTIMA: NEGATIVE

## 2014-06-23 LAB — CULTURE, STREPTOCOCCUS GRP B W/SUSCEPT

## 2014-06-28 ENCOUNTER — Encounter (HOSPITAL_COMMUNITY): Payer: Self-pay | Admitting: *Deleted

## 2014-06-28 ENCOUNTER — Ambulatory Visit (INDEPENDENT_AMBULATORY_CARE_PROVIDER_SITE_OTHER): Payer: Self-pay | Admitting: Family Medicine

## 2014-06-28 VITALS — BP 109/55 | HR 78 | Temp 98.2°F | Wt 170.9 lb

## 2014-06-28 DIAGNOSIS — O3433 Maternal care for cervical incompetence, third trimester: Secondary | ICD-10-CM

## 2014-06-28 LAB — POCT URINALYSIS DIP (DEVICE)
Bilirubin Urine: NEGATIVE
Glucose, UA: NEGATIVE mg/dL
HGB URINE DIPSTICK: NEGATIVE
Ketones, ur: NEGATIVE mg/dL
Leukocytes, UA: NEGATIVE
Nitrite: NEGATIVE
PROTEIN: NEGATIVE mg/dL
UROBILINOGEN UA: 1 mg/dL (ref 0.0–1.0)
pH: 5.5 (ref 5.0–8.0)

## 2014-06-28 NOTE — Progress Notes (Signed)
Patient is 33 y.o. [id number] with hx of successful VBAC and s/p cerclage.  +FM, denies LOF, VB, contractions, vaginal discharge.  Does note some pelvic pressure. - has headache today, has not taken anything for pain.  Reporting some blurred vision intermittently but none currently, no RUQ pain, no change in swelling recently (at baseline) - labor precautions discussed.  GBS and repeat G/C neg

## 2014-06-28 NOTE — Progress Notes (Signed)
Amy [name] Spanish interpreter present for encounter.

## 2014-07-07 ENCOUNTER — Inpatient Hospital Stay (HOSPITAL_COMMUNITY)
Admission: AD | Admit: 2014-07-07 | Discharge: 2014-07-07 | Disposition: A | Discharge status: Home / Self Care | Payer: Self-pay | Source: Ambulatory Visit | Attending: Obstetrics & Gynecology | Admitting: Obstetrics & Gynecology

## 2014-07-07 ENCOUNTER — Encounter (HOSPITAL_COMMUNITY): Payer: Self-pay | Admitting: *Deleted

## 2014-07-07 DIAGNOSIS — O4703 False labor before 37 completed weeks of gestation, third trimester: Secondary | ICD-10-CM | POA: Insufficient documentation

## 2014-07-07 DIAGNOSIS — Z3A38 38 weeks gestation of pregnancy: Secondary | ICD-10-CM | POA: Insufficient documentation

## 2014-07-07 DIAGNOSIS — O471 False labor at or after 37 completed weeks of gestation: Secondary | ICD-10-CM

## 2014-07-07 NOTE — MAU Provider Note (Signed)
  History     CSN: [id number]  Arrival date and time: 07/07/14 [id number]   First Provider Initiated Contact with Patient 07/07/14 [phone]      Chief Complaint  Patient presents with  . Vaginal Bleeding   Vaginal Bleeding Pertinent negatives include no dysuria or hematuria.   Patient is 33 y.o. [id number] [redacted]w[redacted]d.  +FM, denies LOF, VB, contractions, vaginal discharge.    CC initially vaginal bleeding, however patient declines ANY vaginal bleeding with me.  Reporting that she lost mucous which did not occur with previous pregnancies, and was concerned about why this occurred.  Past Medical History  Diagnosis Date  . Medical history non-contributory     Past Surgical History  Procedure Laterality Date  . Cesarean section      one previous  . Cervical cerclage N/A 01/13/2014    Procedure: CERCLAGE CERVICAL;  Surgeon: Adam PhenixJames G Arnold, MD;  Location: [facility];  Service: Gynecology;  Laterality: N/A;    Family History  Problem Relation Age of Onset  . Diabetes Father   . Diabetes Sister     History  Substance Use Topics  . Smoking status: Never Smoker   . Smokeless tobacco: Not on file  . Alcohol Use: No    Allergies:  Allergies  Allergen Reactions  . Penicillins Rash    Prescriptions prior to admission  Medication Sig Dispense Refill Last Dose  . Prenatal Vit-Fe Fumarate-FA (PRENATAL VITAMINS PLUS) 27-1 MG TABS Take 1 tablet by mouth daily.   07/06/2014 at Unknown time  . Clobetasol Prop Emollient Base 0.05 % emollient cream Apply to affected area 30 g 3 Taking  . Triamcinolone Acetonide (TRIAMCINOLONE 0.1 % CREAM : EUCERIN) CREA Apply 1 application topically 2 (two) times daily. 1 each 1 Taking    Review of Systems  Genitourinary: Positive for vaginal bleeding. Negative for dysuria and hematuria.       No vaginal bleeding   Physical Exam   Blood pressure 102/64, pulse 81, temperature 97.9 F (36.6 C), temperature source Oral, resp. rate 16, height 5' 0.5" (1.537 m), weight  175 lb (79.379 kg), last menstrual period 10/12/2013, SpO2 100 %.  Physical Exam  Constitutional: She is oriented to person, place, and time. She appears well-developed and well-nourished.  HENT:  Head: Normocephalic and atraumatic.  Eyes: Conjunctivae and EOM are normal.  Neck: Normal range of motion.  Cardiovascular: Normal rate.   Respiratory: Effort normal. No respiratory distress.  GI: Soft. She exhibits no distension. There is no tenderness.  Musculoskeletal: Normal range of motion. She exhibits no edema.  Neurological: She is alert and oriented to person, place, and time.  Skin: Skin is warm and dry. No erythema.    MAU Course  Procedures  MDM NST reactive (baseline 120 with accelerations, intermittent marked variability, regular contractions but denies feeling them)  Assessment and Plan  Patient is 33 y.o. [id number] [redacted]w[redacted]d here with complaints of loss of mucous plug, denies any vaginal bleeding. - fetal kick counts - labor precautions     Tyrika Newman ROCIO 07/07/2014, 2:21 AM

## 2014-07-07 NOTE — Discharge Instructions (Signed)
Tercer trimestre de embarazo (Third Trimester of Pregnancy) El tercer trimestre va desde la semana29 hasta la 42, desde el sptimo hasta el noveno mes, y es la poca en la que el feto crece ms rpidamente. Hacia el final del noveno mes, el feto mide alrededor de 20pulgadas (45cm) de largo y pesa entre 6 y 10 libras (2,700 y 4,500kg).  CAMBIOS EN EL ORGANISMO Su organismo atraviesa por muchos cambios durante el embarazo, y estos varan de una mujer a otra.   Seguir aumentando de peso. Es de esperar que aumente entre 25 y 35libras (11 y 16kg) hacia el final del embarazo.  Podrn aparecer las primeras estras en las caderas, el abdomen y las mamas.  Puede tener necesidad de orinar con ms frecuencia porque el feto baja hacia la pelvis y ejerce presin sobre la vejiga.  Debido al embarazo podr sentir acidez estomacal con frecuencia.  Puede estar estreida, ya que ciertas hormonas enlentecen los movimientos de los msculos que empujan los desechos a travs de los intestinos.  Pueden aparecer hemorroides o abultarse e hincharse las venas (venas varicosas).  Puede sentir dolor plvico debido al aumento de peso y a que las hormonas del embarazo relajan las articulaciones entre los huesos de la pelvis. El dolor de espalda puede ser consecuencia de la sobrecarga de los msculos que soportan la postura.  Tal vez haya cambios en el cabello que pueden incluir su engrosamiento, crecimiento rpido y cambios en la textura. Adems, a algunas mujeres se les cae el cabello durante o despus del embarazo, o tienen el cabello seco o fino. Lo ms probable es que el cabello se le normalice despus del nacimiento del beb.  Las mamas seguirn creciendo y le dolern. A veces, puede haber una secrecin amarilla de las mamas llamada calostro.  El ombligo puede salir hacia afuera.  Puede sentir que le falta el aire debido a que se expande el tero.  Puede notar que el feto "baja" o lo siente ms bajo, en el  abdomen.  Puede tener una prdida de secrecin mucosa con sangre. Esto suele ocurrir en el trmino de unos pocos das a una semana antes de que comience el trabajo de parto.  El cuello del tero se vuelve delgado y blando (se borra) cerca de la fecha de parto. QU DEBE ESPERAR EN LOS EXMENES PRENATALES  Le harn exmenes prenatales cada 2semanas hasta la semana36. A partir de ese momento le harn exmenes semanales. Durante una visita prenatal de rutina:  La pesarn para asegurarse de que usted y el feto estn creciendo normalmente.  Le tomarn la presin arterial.  Le medirn el abdomen para controlar el desarrollo del beb.  Se escucharn los latidos cardacos fetales.  Se evaluarn los resultados de los estudios solicitados en visitas anteriores.  Le revisarn el cuello del tero cuando est prxima la fecha de parto para controlar si este se ha borrado. Alrededor de la semana36, el mdico le revisar el cuello del tero. Al mismo tiempo, realizar un anlisis de las secreciones del tejido vaginal. Este examen es para determinar si hay un tipo de bacteria, estreptococo Grupo B. El mdico le explicar esto con ms detalle. El mdico puede preguntarle lo siguiente:  Cmo le gustara que fuera el parto.  Cmo se siente.  Si siente los movimientos del beb.  Si ha tenido sntomas anormales, como prdida de lquido, sangrado, dolores de cabeza intensos o clicos abdominales.  Si tiene alguna pregunta. Otros exmenes o estudios de deteccin que pueden realizarse   durante el tercer trimestre incluyen lo siguiente:  Anlisis de sangre para controlar las concentraciones de hierro (anemia).  Controles fetales para determinar su salud, nivel de actividad y crecimiento. Si tiene alguna enfermedad o hay problemas durante el embarazo, le harn estudios. FALSO TRABAJO DE PARTO Es posible que sienta contracciones leves e irregulares que finalmente desaparecen. Se llaman contracciones de  Braxton Hicks o falso trabajo de parto. Las contracciones pueden durar horas, das o incluso semanas, antes de que el verdadero trabajo de parto se inicie. Si las contracciones ocurren a intervalos regulares, se intensifican o se hacen dolorosas, lo mejor es que la revise el mdico.  SIGNOS DE TRABAJO DE PARTO   Clicos de tipo menstrual.  Contracciones cada 5minutos o menos.  Contracciones que comienzan en la parte superior del tero y se extienden hacia abajo, a la zona inferior del abdomen y la espalda.  Sensacin de mayor presin en la pelvis o dolor de espalda.  Una secrecin de mucosidad acuosa o con sangre que sale de la vagina. Si tiene alguno de estos signos antes de la semana37 del embarazo, llame a su mdico de inmediato. Debe concurrir al hospital para que la controlen inmediatamente. INSTRUCCIONES PARA EL CUIDADO EN EL HOGAR   Evite fumar, consumir hierbas, beber alcohol y tomar frmacos que no le hayan recetado. Estas sustancias qumicas afectan la formacin y el desarrollo del beb.  Siga las indicaciones del mdico en relacin con el uso de medicamentos. Durante el embarazo, hay medicamentos que son seguros de tomar y otros que no.  Haga actividad fsica solo en la forma indicada por el mdico. Sentir clicos uterinos es un buen signo para detener la actividad fsica.  Contine comiendo alimentos que sanos con regularidad.  Use un sostn que le brinde buen soporte si le duelen las mamas.  No se d baos de inmersin en agua caliente, baos turcos ni saunas.  Colquese el cinturn de seguridad cuando conduzca.  No coma carne cruda ni queso sin cocinar; evite el contacto con las bandejas sanitarias de los gatos y la tierra que estos animales usan. Estos elementos contienen grmenes que pueden causar defectos congnitos en el beb.  Tome las vitaminas prenatales.  Si est estreida, pruebe un laxante suave (si el mdico lo autoriza). Consuma ms alimentos ricos en  fibra, como vegetales y frutas frescos y cereales integrales. Beba gran cantidad de lquido para mantener la orina de tono claro o color amarillo plido.  Dese baos de asiento con agua tibia para aliviar el dolor o las molestias causadas por las hemorroides. Use una crema para las hemorroides si el mdico la autoriza.  Si tiene venas varicosas, use medias de descanso. Eleve los pies durante 15minutos, 3 o 4veces por da. Limite la cantidad de sal en su dieta.  Evite levantar objetos pesados, use zapatos de tacones bajos y mantenga una buena postura.  Descanse con las piernas elevadas si tiene calambres o dolor de cintura.  Visite a su dentista si no lo ha hecho durante el embarazo. Use un cepillo de dientes blando para higienizarse los dientes y psese el hilo dental con suavidad.  Puede seguir manteniendo relaciones sexuales, a menos que el mdico le indique lo contrario.  No haga viajes largos excepto que sea absolutamente necesario y solo con la autorizacin del mdico.  Tome clases prenatales para entender, practicar y hacer preguntas sobre el trabajo de parto y el parto.  Haga un ensayo de la partida al hospital.  Prepare el bolso que   llevar al hospital.  Prepare la habitacin del beb.  Concurra a todas las visitas prenatales segn las indicaciones de su mdico. SOLICITE ATENCIN MDICA SI:  No est segura de que est en trabajo de parto o de que ha roto la bolsa de las aguas.  Tiene mareos.  Siente clicos leves, presin en la pelvis o dolor persistente en el abdomen.  Tiene nuseas, vmitos o diarrea persistentes.  Tiene secrecin vaginal con mal olor.  Siente dolor al orinar. SOLICITE ATENCIN MDICA DE INMEDIATO SI:   Tiene fiebre.  Tiene una prdida de lquido por la vagina.  Tiene sangrado o pequeas prdidas vaginales.  Siente dolor intenso o clicos en el abdomen.  Sube o baja de peso rpidamente.  Tiene dificultad para respirar y siente dolor de  pecho.  Sbitamente se le hinchan mucho el rostro, las manos, los tobillos, los pies o las piernas.  No ha sentido los movimientos del beb durante una hora.  Siente un dolor de cabeza intenso que no se alivia con medicamentos.  Hay cambios en la visin. Document Released: 05/23/2005 Document Revised: 08/18/2013 [employer] Patient Information 2015 [employer]. This information is not intended to replace advice given to you by your health care provider. Make sure you discuss any questions you have with your health care provider.  

## 2014-07-07 NOTE — MAU Note (Signed)
Pt reports heavy bleeding that started at 5 pm. Now just spotting.

## 2014-07-07 NOTE — Progress Notes (Signed)
I assisted Kim in MAU with registration by Orlan LeavensViria Alvarez, Interpreter

## 2014-07-09 ENCOUNTER — Encounter (HOSPITAL_COMMUNITY): Payer: Self-pay | Admitting: *Deleted

## 2014-07-09 ENCOUNTER — Inpatient Hospital Stay (HOSPITAL_COMMUNITY): Payer: Medicaid Other | Admitting: Anesthesiology

## 2014-07-09 ENCOUNTER — Inpatient Hospital Stay (HOSPITAL_COMMUNITY)
Admission: AD | Admit: 2014-07-09 | Discharge: 2014-07-11 | DRG: 989 | Disposition: A | Discharge status: Home / Self Care | Payer: Medicaid Other | Source: Ambulatory Visit | Attending: Obstetrics and Gynecology | Admitting: Obstetrics and Gynecology

## 2014-07-09 DIAGNOSIS — O3421 Maternal care for scar from previous cesarean delivery: Secondary | ICD-10-CM | POA: Diagnosis present

## 2014-07-09 DIAGNOSIS — Z3483 Encounter for supervision of other normal pregnancy, third trimester: Secondary | ICD-10-CM | POA: Diagnosis present

## 2014-07-09 DIAGNOSIS — Z3A39 39 weeks gestation of pregnancy: Secondary | ICD-10-CM

## 2014-07-09 DIAGNOSIS — N760 Acute vaginitis: Secondary | ICD-10-CM

## 2014-07-09 DIAGNOSIS — O3433 Maternal care for cervical incompetence, third trimester: Secondary | ICD-10-CM

## 2014-07-09 DIAGNOSIS — O34219 Maternal care for unspecified type scar from previous cesarean delivery: Secondary | ICD-10-CM

## 2014-07-09 DIAGNOSIS — Z3493 Encounter for supervision of normal pregnancy, unspecified, third trimester: Secondary | ICD-10-CM

## 2014-07-09 DIAGNOSIS — IMO0001 Reserved for inherently not codable concepts without codable children: Secondary | ICD-10-CM

## 2014-07-09 DIAGNOSIS — B9689 Other specified bacterial agents as the cause of diseases classified elsewhere: Secondary | ICD-10-CM

## 2014-07-09 LAB — CBC
HCT: 30.7 % — ABNORMAL LOW (ref 36.0–46.0)
HCT: 27.1 % — ABNORMAL LOW (ref 36.0–46.0)
Hemoglobin: 9.8 g/dL — ABNORMAL LOW (ref 12.0–15.0)
Hemoglobin: 8.8 g/dL — ABNORMAL LOW (ref 12.0–15.0)
MCH: 24.7 pg — AB (ref 26.0–34.0)
MCH: 25 pg — ABNORMAL LOW (ref 26.0–34.0)
MCHC: 31.9 g/dL (ref 30.0–36.0)
MCHC: 32.5 g/dL (ref 30.0–36.0)
MCV: 77.3 fL — AB (ref 78.0–100.0)
MCV: 77 fL — AB (ref 78.0–100.0)
PLATELETS: 154 10*3/uL (ref 150–400)
Platelets: 138 10*3/uL — ABNORMAL LOW (ref 150–400)
RBC: 3.97 MIL/uL (ref 3.87–5.11)
RBC: 3.52 MIL/uL — ABNORMAL LOW (ref 3.87–5.11)
RDW: 14.9 % (ref 11.5–15.5)
RDW: 15 % (ref 11.5–15.5)
WBC: 7.8 10*3/uL (ref 4.0–10.5)
WBC: 14.6 10*3/uL — ABNORMAL HIGH (ref 4.0–10.5)

## 2014-07-09 LAB — TYPE AND SCREEN
ABO/RH(D): A POS
Antibody Screen: NEGATIVE

## 2014-07-09 LAB — HIV ANTIBODY (ROUTINE TESTING W REFLEX): HIV 1&2 Ab, 4th Generation: NONREACTIVE

## 2014-07-09 LAB — RPR

## 2014-07-09 MED ORDER — LACTATED RINGERS IV SOLN
500.0000 mL | Freq: Once | INTRAVENOUS | Status: AC
  Administered 2014-07-09: 500 mL via INTRAVENOUS

## 2014-07-09 MED ORDER — OXYTOCIN BOLUS FROM INFUSION
500.0000 mL | INTRAVENOUS | Status: DC
Start: 2014-07-09 — End: 2014-07-10
  Administered 2014-07-09: 500 mL via INTRAVENOUS

## 2014-07-09 MED ORDER — IBUPROFEN 600 MG PO TABS
600.0000 mg | ORAL_TABLET | Freq: Four times a day (QID) | ORAL | Status: DC
  Administered 2014-07-09 – 2014-07-11 (×7): 600 mg via ORAL
  Filled 2014-07-09 (×8): qty 1

## 2014-07-09 MED ORDER — ONDANSETRON HCL 4 MG/2ML IJ SOLN: 4.0000 mg | Freq: Four times a day (QID) | INTRAMUSCULAR | Status: DC | PRN

## 2014-07-09 MED ORDER — LIDOCAINE HCL (PF) 1 % IJ SOLN
30.0000 mL | INTRAMUSCULAR | Status: DC | PRN
  Administered 2014-07-09: 30 mL via SUBCUTANEOUS
  Filled 2014-07-09: qty 30

## 2014-07-09 MED ORDER — EPHEDRINE 5 MG/ML INJ
10.0000 mg | INTRAVENOUS | Status: DC | PRN
  Filled 2014-07-09: qty 2

## 2014-07-09 MED ORDER — FENTANYL 2.5 MCG/ML BUPIVACAINE 1/10 % EPIDURAL INFUSION (WH - ANES)
14.0000 mL/h | INTRAMUSCULAR | Status: DC | PRN
  Administered 2014-07-09 (×2): 14 mL/h via EPIDURAL
  Filled 2014-07-09 (×2): qty 125

## 2014-07-09 MED ORDER — BUPIVACAINE HCL (PF) 0.25 % IJ SOLN
INTRAMUSCULAR | Status: DC | PRN
  Administered 2014-07-09 (×2): 5 mL via EPIDURAL

## 2014-07-09 MED ORDER — PHENYLEPHRINE 40 MCG/ML (10ML) SYRINGE FOR IV PUSH (FOR BLOOD PRESSURE SUPPORT)
80.0000 ug | PREFILLED_SYRINGE | INTRAVENOUS | Status: DC | PRN
  Filled 2014-07-09: qty 2

## 2014-07-09 MED ORDER — CEFAZOLIN SODIUM-DEXTROSE 2-3 GM-% IV SOLR
2.0000 g | Freq: Once | INTRAVENOUS | Status: AC
  Administered 2014-07-09: 2 g via INTRAVENOUS
  Filled 2014-07-09: qty 50

## 2014-07-09 MED ORDER — PHENYLEPHRINE 40 MCG/ML (10ML) SYRINGE FOR IV PUSH (FOR BLOOD PRESSURE SUPPORT)
80.0000 ug | PREFILLED_SYRINGE | INTRAVENOUS | Status: DC | PRN
  Filled 2014-07-09: qty 10
  Filled 2014-07-09: qty 2

## 2014-07-09 MED ORDER — LACTATED RINGERS IV SOLN
500.0000 mL | INTRAVENOUS | Status: DC | PRN
  Administered 2014-07-09: 150 mL via INTRAVENOUS

## 2014-07-09 MED ORDER — DIPHENHYDRAMINE HCL 50 MG/ML IJ SOLN: 12.5000 mg | INTRAMUSCULAR | Status: DC | PRN

## 2014-07-09 MED ORDER — OXYTOCIN 40 UNITS IN LACTATED RINGERS INFUSION - SIMPLE MED
62.5000 mL/h | INTRAVENOUS | Status: DC
  Administered 2014-07-09: 62.5 mL/h via INTRAVENOUS
  Filled 2014-07-09: qty 1000

## 2014-07-09 MED ORDER — OXYCODONE-ACETAMINOPHEN 5-325 MG PO TABS: 2.0000 | ORAL_TABLET | ORAL | Status: DC | PRN

## 2014-07-09 MED ORDER — LIDOCAINE HCL (PF) 1 % IJ SOLN
INTRAMUSCULAR | Status: DC | PRN
  Administered 2014-07-09 (×7): 4 mL

## 2014-07-09 MED ORDER — CITRIC ACID-SODIUM CITRATE 334-500 MG/5ML PO SOLN: 30.0000 mL | ORAL | Status: DC | PRN

## 2014-07-09 MED ORDER — ACETAMINOPHEN 325 MG PO TABS: 650.0000 mg | ORAL_TABLET | ORAL | Status: DC | PRN

## 2014-07-09 MED ORDER — LACTATED RINGERS IV SOLN
INTRAVENOUS | Status: DC
  Administered 2014-07-09 (×2): via INTRAVENOUS

## 2014-07-09 MED ORDER — OXYCODONE-ACETAMINOPHEN 5-325 MG PO TABS: 1.0000 | ORAL_TABLET | ORAL | Status: DC | PRN

## 2014-07-09 MED ORDER — LACTATED RINGERS IV SOLN
INTRAVENOUS | Status: DC
  Administered 2014-07-09: 11:00:00 via INTRAVENOUS

## 2014-07-09 NOTE — H&P (Signed)
LABOR ADMISSION HISTORY AND PHYSICAL  Amy [name] is a 33 y.o. female [id number] with IUP at 2978w1d by 9 weeks dating sono presenting in active labor. She reports +FMs, No LOF, no VB, no blurry vision, headaches or peripheral edema, and RUQ pain. She desires an epidural for labor pain control. She plans on breast and bottle feeding. She request OCPs for birth control, or BTS but did not sign consents.  Dating: By 233w0d dating sono --->  Estimated Date of Delivery: 07/15/14   Prenatal History/Complications:  Past Medical History: Past Medical History  Diagnosis Date  . Medical history non-contributory     Past Surgical History: Past Surgical History  Procedure Laterality Date  . Cesarean section      one previous  . Cervical cerclage N/A 01/13/2014    Procedure: CERCLAGE CERVICAL;  Surgeon: Adam PhenixJames G Arnold, MD;  Location: [facility];  Service: Gynecology;  Laterality: N/A;    Obstetrical History: OB History    Gravida Para Term Preterm AB TAB SAB Ectopic Multiple Living   5 3 3  0 1  1   3        Social History: History   Social History  . Marital Status: Single    Spouse Name: N/A    Number of Children: N/A  . Years of Education: N/A   Social History Main Topics  . Smoking status: Never Smoker   . Smokeless tobacco: Never Used  . Alcohol Use: No  . Drug Use: No  . Sexual Activity: Yes    Birth Control/ Protection: None   Other Topics Concern  . None   Social History Narrative    Family History: Family History  Problem Relation Age of Onset  . Diabetes Father   . Diabetes Sister     Allergies: Allergies  Allergen Reactions  . Penicillins Rash    Prescriptions prior to admission  Medication Sig Dispense Refill Last Dose  . Clobetasol Prop Emollient Base 0.05 % emollient cream Apply to affected area (Patient not taking: Reported on 07/09/2014) 30 g 3 Taking  . Prenatal Vit-Fe Fumarate-FA (PRENATAL VITAMINS PLUS) 27-1 MG TABS Take 1 tablet by mouth  daily.   07/06/2014 at Unknown time  . Triamcinolone Acetonide (TRIAMCINOLONE 0.1 % CREAM : EUCERIN) CREA Apply 1 application topically 2 (two) times daily. (Patient not taking: Reported on 07/09/2014) 1 each 1 Taking     Review of Systems   All systems reviewed and negative except as stated in HPI  Blood pressure 105/69, pulse 78, temperature 97.6 F (36.4 C), temperature source Oral, resp. rate 18, height 5' 0.5" (1.537 m), weight 175 lb (79.379 kg), last menstrual period 10/12/2013. General appearance: alert and cooperative Lungs: clear to auscultation bilaterally Heart: regular rate and rhythm Abdomen: soft, non-tender; bowel sounds normal Extremities: Homans sign is negative, no sign of DVT Presentation: cephalic Fetal monitoringBaseline: 140 bpm, Variability: Good {> 6 bpm), Accelerations: Reactive and Decelerations: Absent Uterine activity regular q152min Dilation: 5 (Membranes bulging) Effacement (%): 90 Station: -3 Exam by:: Dellie BurnsScarlett Murray, RN BSN   Prenatal labs: ABO, Rh: A/POS/-- (05/04 1048) Antibody: NEG (05/04 1048) Rubella:   RPR: Nonreactive (09/15 0000)  HBsAg: NEGATIVE (05/04 1048)  HIV: Non-reactive (09/15 0000)  GBS: Negative (10/26 0000)  1 hr Glucola 105 Genetic screening  Normal 1st screen, AFP not done Anatomy US normal    [facility]  Dating  9 wk ultrasound  Genetic Screen 1 Screen:  nml  AFP:   Wrong test done, unable to do right test in time                Anatomic US Normal anatomy at 19 weeks, cervix 4.8 cm in length, cerclage in place  GTT Early:               Third trimester: 105  TDaP vaccine 05/10/2014  Flu vaccine 05/10/2014  GBS negative  Contraception Depo  Baby Food Breast  Circumcision Yes, outpatient  Pediatrician [facility]  Support Person No support person identified, FOB is not involved         No results found for this or any previous visit (from the past 24 hour(s)).  Patient Active Problem List    Diagnosis Date Noted  . 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 Tinea versicolor 12/30/2013  . Bacterial vaginosis 12/30/2013  . Supervision of normal pregnancy 12/28/2013  . Previous cesarean delivery, antepartum condition or complication 12/28/2013  . Cervical incompetence affecting management of pregnancy, antepartum 12/28/2013    Assessment: Amy [name] is a 33 y.o. [id number] at 550w1d here for in active labor desiring TOL with hx of 2 previous successful VBAC   #Labor: progressing normally #Pain: Epidural upon request when transferred to birthing suites #FWB: Cat I #ID:  GBS neg #MOF: breast x 3 months, then bottle #MOC:OCPs, requesting BTS but did not sign consents #Circ:  outpt Circ   Risk of uterine rupture at term is 0.78 percent with TOLAC and 0.22 percent with ERCD. 1 in 10 uterine ruptures will result in neonatal death or neurological injury. The benefits of a trial of labor after cesarean (TOLAC) resulting in a vaginal birth after cesarean (VBAC) include the following: shorter length of hospital stay and postpartum recovery (in most cases); fewer complications, such as postpartum fever, wound or uterine infection, thromboembolism (blood clots in the leg or lung), need for blood transfusion and fewer neonatal breathing problems. The risks of an attempted VBAC or TOLAC include the following: Risk of failed trial of labor after cesarean (TOLAC) without a vaginal birth after cesarean (VBAC) resulting in repeat cesarean delivery (RCD) in about 20 to 40 percent of women who attempt VBAC.  Risk of rupture of uterus resulting in an emergency cesarean delivery. The risk of uterine rupture may be related in part to the type of uterine incision made during the first cesarean delivery. A previous transverse uterine incision has the lowest risk of rupture (0.2 to 1.5 percent risk). Vertical or T-shaped uterine incisions have a higher risk of uterine  rupture (4 to 9 percent risk)The risk of fetal death is very low with both VBAC and elective repeat cesarean delivery (ERCD), but the likelihood of fetal death is higher with VBAC than with ERCD. Maternal death is very rare with either type of delivery. The risks of an elective repeat cesarean delivery (ERCD) were reviewed with the patient including but not limited to: 09/998 risk of uterine rupture which could have serious consequences, bleeding which may require transfusion; infection which may require antibiotics; injury to bowel, bladder or other surrounding organs (bowel, bladder, ureters); injury to the fetus; need for additional procedures including hysterectomy in the event of a life-threatening hemorrhage; thromboembolic phenomenon; abnormal placentation; incisional problems; death and other postoperative or anesthesia complications.    These risks and benefits are summarized on the consent form, which was reviewed with the patient during the visit.  All her questions answered and she signed a consent indicating a  preference for TOLAC/ERCD. A copy of the consent was given to the patient.   Amylah Will ROCIO 07/09/2014, 9:53 AM

## 2014-07-09 NOTE — MAU Note (Signed)
Patient presents with complaint of contractions since 0400 today. Denies bleeding or LOF and states fetus is active.

## 2014-07-09 NOTE — Anesthesia Preprocedure Evaluation (Signed)
Anesthesia Evaluation  Patient identified by MRN, date of birth, ID band Patient awake    Reviewed: Allergy & Precautions, H&P , NPO status , Patient's Chart, lab work & pertinent test results, reviewed documented beta blocker date and time   History of Anesthesia Complications Negative for: history of anesthetic complications  Airway Mallampati: I  TM Distance: >3 FB Neck ROM: full    Dental  (+) Teeth Intact   Pulmonary neg pulmonary ROS,  breath sounds clear to auscultation        Cardiovascular negative cardio ROS  Rhythm:regular Rate:Normal     Neuro/Psych negative neurological ROS  negative psych ROS   GI/Hepatic negative GI ROS, Neg liver ROS,   Endo/Other  negative endocrine ROS  Renal/GU negative Renal ROS     Musculoskeletal   Abdominal   Peds  Hematology  (+) anemia ,   Anesthesia Other Findings   Reproductive/Obstetrics (+) Pregnancy (h/o C/S x1, for VBAC)                             Anesthesia Physical Anesthesia Plan  ASA: II  Anesthesia Plan: Epidural   Post-op Pain Management:    Induction:   Airway Management Planned:   Additional Equipment:   Intra-op Plan:   Post-operative Plan:   Informed Consent: I have reviewed the patients History and Physical, chart, labs and discussed the procedure including the risks, benefits and alternatives for the proposed anesthesia with the patient or authorized representative who has indicated his/her understanding and acceptance.     Plan Discussed with:   Anesthesia Plan Comments:         Anesthesia Quick Evaluation

## 2014-07-09 NOTE — Progress Notes (Signed)
Dr Rodman Picklecassidy at bedside continuing to assess patient pain level, despite attempt for epidural redose. Patient continues to c/o of sharp pain with UCs, as well as constant pain in between UCs. Abdomen is relaxed in between UCs, but very tender to touch. Plan to replace epidural. OBRRRN and unit charge RN aware of patient's complaints, history to TOLAC, and POC.

## 2014-07-09 NOTE — Consult Note (Signed)
Neonatology Note:  Attendance at Code Apgar:  Our team responded to a Code Apgar call to room # 172 following NSVD, due to shoulder dystocia. The requesting physician was Dr. Ferguson. The mother is a G5P3A1 A pos, GBS neg with VBAC and cerclage. ROM occurred 8 hours PTD and the fluid was clear. At delivery, the baby was somewhat slow to cry and was floppy. The OB nursing staff in attendance gave vigorous stimulation; the Code Apgar had already been called called. Our team arrived at 1.5 minutes of life, at which time the baby was breathing, but was labored, with retractions and grunting, and appeared dusky. Breath sounds were equal, but with coarse rhonchi. We placed a pulse oximeter and began to give BBO2 at about 3-4 minutes of life, when his O2 saturations were below acceptable parameters. We bulb suctioned, but there was audible congestion, so we DeLee suctioned twice and got 8 ml of thick, clear mucous out. We performed chest PT, and the breath sounds became clearer, but he continued to need supplemental O2 and was grunting continuously. Ap 5/8/9. I spoke with the mother in the DR, then transferred the baby to the NICU for further care. PE remarkable for LGA infant, not having spontaneous movement of the right arm.  Idora Brosious C. Iliany Losier, MD 

## 2014-07-09 NOTE — MAU Note (Signed)
Birthing Suites notified of patient admission but is unable to accept patient at this time.

## 2014-07-09 NOTE — Progress Notes (Signed)
Pt up to bathroom on stedy; c/o feeling lightheaded and dizzy. Returned to bed on stedy. Pt vomited 700 cc. BP WNL. Uterus is firm, midline at umbilicus. Bleeding is small. Dr. Emelda FearFerguson at bedside. New orders received. Will monitor on L&D for several more hours.

## 2014-07-09 NOTE — Anesthesia Procedure Notes (Addendum)
Epidural Patient location during procedure: OB Start time: 07/09/2014 11:20 AM  Staffing Anesthesiologist: Ishanvi Mcquitty Performed by: anesthesiologist   Preanesthetic Checklist Completed: patient identified, site marked, surgical consent, pre-op evaluation, timeout performed, IV checked, risks and benefits discussed and monitors and equipment checked  Epidural Patient position: sitting Prep: site prepped and draped and DuraPrep Patient monitoring: continuous pulse ox and blood pressure Approach: midline Location: L3-L4 Injection technique: LOR air  Needle:  Needle type: Tuohy  Needle gauge: 17 G Needle length: 9 cm and 9 Needle insertion depth: 4 cm Catheter type: closed end flexible Catheter size: 19 Gauge Catheter at skin depth: 9 cm Test dose: negative  Assessment Events: blood not aspirated, injection not painful, no injection resistance, negative IV test and no paresthesia  Additional Notes Discussed risk of headache, infection, bleeding, nerve injury and failed or incomplete block.  Patient voices understanding and wishes to proceed.  Epidural placed easily on first attempt.  No paresthesia.  Patient tolerated procedure well with no apparent complications.  Jasmine DecemberA. Juliany Daughety, MDReason for block:procedure for pain  Epidural Patient location during procedure: OB Start time: 07/09/2014 1:17 PM  Staffing Anesthesiologist: Noriko Macari Performed by: anesthesiologist   Preanesthetic Checklist Completed: patient identified, site marked, surgical consent, pre-op evaluation, timeout performed, IV checked, risks and benefits discussed and monitors and equipment checked  Epidural Patient position: sitting Prep: site prepped and draped and DuraPrep Patient monitoring: continuous pulse ox and blood pressure Approach: midline Location: L3-L4 Injection technique: LOR air  Needle:  Needle type: Tuohy  Needle gauge: 17 G Needle length: 9 cm and 9 Needle insertion depth: 5.5  cm Catheter type: closed end flexible Catheter size: 19 Gauge Catheter at skin depth: 10.5 cm Test dose: negative  Assessment Events: blood not aspirated, injection not painful, no injection resistance, negative IV test and no paresthesia  Additional Notes Discussed risk of headache, infection, bleeding, nerve injury and failed or incomplete block.  Patient voices understanding and wishes to proceed.  Epidural placed easily on first attempt.  No paresthesia.  Patient tolerated procedure well with no apparent complications.  A> Kasheem Toner, MDReason for block:procedure for pain

## 2014-07-10 ENCOUNTER — Encounter (HOSPITAL_COMMUNITY): Payer: Self-pay | Admitting: *Deleted

## 2014-07-10 DIAGNOSIS — O3421 Maternal care for scar from previous cesarean delivery: Secondary | ICD-10-CM

## 2014-07-10 DIAGNOSIS — Z3A39 39 weeks gestation of pregnancy: Secondary | ICD-10-CM

## 2014-07-10 LAB — CBC
HEMATOCRIT: 23 % — AB (ref 36.0–46.0)
Hemoglobin: 7.6 g/dL — ABNORMAL LOW (ref 12.0–15.0)
MCH: 25.2 pg — ABNORMAL LOW (ref 26.0–34.0)
MCHC: 33 g/dL (ref 30.0–36.0)
MCV: 76.2 fL — AB (ref 78.0–100.0)
Platelets: 133 10*3/uL — ABNORMAL LOW (ref 150–400)
RBC: 3.02 MIL/uL — AB (ref 3.87–5.11)
RDW: 14.9 % (ref 11.5–15.5)
WBC: 17 10*3/uL — ABNORMAL HIGH (ref 4.0–10.5)

## 2014-07-10 MED ORDER — OXYCODONE-ACETAMINOPHEN 5-325 MG PO TABS
2.0000 | ORAL_TABLET | ORAL | Status: DC | PRN
  Administered 2014-07-10 – 2014-07-11 (×3): 2 via ORAL
  Filled 2014-07-10 (×3): qty 2

## 2014-07-10 MED ORDER — LANOLIN HYDROUS EX OINT: TOPICAL_OINTMENT | CUTANEOUS | Status: DC | PRN

## 2014-07-10 MED ORDER — ZOLPIDEM TARTRATE 5 MG PO TABS: 5.0000 mg | ORAL_TABLET | Freq: Every evening | ORAL | Status: DC | PRN

## 2014-07-10 MED ORDER — SENNOSIDES-DOCUSATE SODIUM 8.6-50 MG PO TABS
2.0000 | ORAL_TABLET | ORAL | Status: DC
  Administered 2014-07-10 (×2): 2 via ORAL
  Filled 2014-07-10 (×2): qty 2

## 2014-07-10 MED ORDER — DIPHENHYDRAMINE HCL 25 MG PO CAPS: 25.0000 mg | ORAL_CAPSULE | Freq: Four times a day (QID) | ORAL | Status: DC | PRN

## 2014-07-10 MED ORDER — TETANUS-DIPHTH-ACELL PERTUSSIS 5-2.5-18.5 LF-MCG/0.5 IM SUSP: 0.5000 mL | Freq: Once | INTRAMUSCULAR | Status: DC

## 2014-07-10 MED ORDER — WITCH HAZEL-GLYCERIN EX PADS: 1.0000 "application " | MEDICATED_PAD | CUTANEOUS | Status: DC | PRN

## 2014-07-10 MED ORDER — OXYCODONE-ACETAMINOPHEN 5-325 MG PO TABS
1.0000 | ORAL_TABLET | ORAL | Status: DC | PRN
  Administered 2014-07-10 (×2): 1 via ORAL
  Filled 2014-07-10 (×2): qty 1

## 2014-07-10 MED ORDER — BENZOCAINE-MENTHOL 20-0.5 % EX AERO
1.0000 "application " | INHALATION_SPRAY | CUTANEOUS | Status: DC | PRN
  Administered 2014-07-10 – 2014-07-11 (×2): 1 via TOPICAL
  Filled 2014-07-10 (×2): qty 56

## 2014-07-10 MED ORDER — CEPHALEXIN 500 MG PO CAPS
500.0000 mg | ORAL_CAPSULE | Freq: Four times a day (QID) | ORAL | Status: DC
  Administered 2014-07-10 – 2014-07-11 (×6): 500 mg via ORAL
  Filled 2014-07-10 (×10): qty 1

## 2014-07-10 MED ORDER — DIBUCAINE 1 % RE OINT: 1.0000 "application " | TOPICAL_OINTMENT | RECTAL | Status: DC | PRN

## 2014-07-10 MED ORDER — ONDANSETRON HCL 4 MG PO TABS: 4.0000 mg | ORAL_TABLET | ORAL | Status: DC | PRN

## 2014-07-10 MED ORDER — ONDANSETRON HCL 4 MG/2ML IJ SOLN: 4.0000 mg | INTRAMUSCULAR | Status: DC | PRN

## 2014-07-10 MED ORDER — SIMETHICONE 80 MG PO CHEW: 80.0000 mg | CHEWABLE_TABLET | ORAL | Status: DC | PRN

## 2014-07-10 MED ORDER — PRENATAL MULTIVITAMIN CH
1.0000 | ORAL_TABLET | Freq: Every day | ORAL | Status: DC
  Administered 2014-07-10 – 2014-07-11 (×2): 1 via ORAL
  Filled 2014-07-10 (×2): qty 1

## 2014-07-10 NOTE — Anesthesia Postprocedure Evaluation (Signed)
Anesthesia Post Note  Patient: Amy [name]  Procedure(s) Performed: * No procedures listed *  Anesthesia type: Epidural  Patient location: Mother/Baby  Post pain: Pain level controlled  Post assessment: Post-op Vital signs reviewed  Last Vitals:  Filed Vitals:   07/10/14 0540  BP: 98/50  Pulse: 74  Temp: 36.6 C  Resp: 18    Post vital signs: Reviewed  Level of consciousness:alert  Complications: No apparent anesthesia complications

## 2014-07-10 NOTE — Plan of Care (Signed)
Problem: Consults Goal: Postpartum Patient Education (See Patient Education module for education specifics.) Outcome: Completed/Met Date Met:  07/10/14  Problem: Phase I Progression Outcomes Goal: Pain controlled with appropriate interventions Outcome: Completed/Met Date Met:  07/10/14 Goal: Initial discharge plan identified Outcome: Completed/Met Date Met:  07/10/14  Problem: Phase II Progression Outcomes Goal: Tolerating diet Outcome: Completed/Met Date Met:  07/10/14

## 2014-07-10 NOTE — Plan of Care (Signed)
Problem: Phase I Progression Outcomes Goal: Voiding adequately Outcome: Completed/Met Date Met:  07/10/14 Goal: OOB as tolerated unless otherwise ordered Outcome: Completed/Met Date Met:  07/10/14 Goal: IS, TCDB as ordered Outcome: Not Applicable Date Met:  99/24/26

## 2014-07-10 NOTE — Progress Notes (Signed)
Patient ID: Vergie LivingCarmela Enriquez-Madariaga, female   DOB: 24-Aug-1981, 33 y.o.   MRN: [id number] Operative Delivery Note At 7:49 PM a viable and healthy female was delivered via VBAC, Vacuum Assisted.  Presentation: vertex; Position: Occiput,, Anterior; Station: +3. No popoffs.  Indication. Fetal bradycardia. Kiwi vacuum assist used thru 2 contractions, with easy descent of vertex down to crowning position, with vacuum taken off and pt allowed to push the baby the rest of the way to expulsion of the vertex.  Delivery of the head: 07/09/2014  7:48 PM First maneuver: 07/09/2014  7:48 PM, McRoberts Second maneuver: 07/09/2014  7:48 PM, Suprapubic Pressure Third maneuver: 07/09/2014  7:49 PM,  Wood screw, using the left axilla to effect counterclockwise rotation of the fetal body to left shouder being out of introitus, when the left arm could be flexed at axilla, and released completely , then the rest of the body released, and baby taken promptly to the warmer and pediatrics called to the room for infant care. Fourth maneuver: ,   Fifth maneuver: ,   Sixth maneuver: ,    Verbal consent: obtained from patient.  APGAR: 5, 8; weight 9 lb 9.1 oz (4340 g).   Placenta status: Intact, Spontaneous.   Cord: 3 vessels with the following complications: None.  Cord pH: collected   Anesthesia: Epidural  Episiotomy: None Lacerations: 3rd degree Suture Repair: 2.0 vicryl Est. Blood Loss (mL): 600  Mom to mother baby.  Baby to NICU.  Moni Rothrock V 07/10/2014, 6:44 AM

## 2014-07-10 NOTE — Progress Notes (Signed)
I assisted Insurance claims handlerJill RN with some questions,by Orlan LeavensViria Alvarez Interpreter

## 2014-07-10 NOTE — Progress Notes (Signed)
Post Partum Day 1 s/p Vacuum assisted VBAC, with significant shoulder dystocia, with baby to NICU, right clavicle . Weight 9+9, Apgars 5/8 Subjective: tolerating PO and quite sore from the repair.   Objective: Blood pressure 98/50, pulse 74, temperature 97.8 F (36.6 C), temperature source Oral, resp. rate 18, height 5' 0.5" (1.537 m), weight 79.379 kg (175 lb), last menstrual period 10/12/2013, SpO2 98 %, unknown if currently breastfeeding.  Physical Exam:  General: alert, cooperative and no distress Lochia: appropriate Uterine Fundus: firm at u-0 Incision: vulva intact after difficult repair, cleansed x 2 prior to closure. Given Ancef 2 gm at repair. DVT Evaluation: No evidence of DVT seen on physical exam.   Recent Labs  07/09/14 2040 07/10/14 0510  HGB 8.8* 7.6*  HCT 27.1* 23.0*    Assessment/Plan: Plan for discharge tomorrow   LOS: 1 day   Elman Dettman V 07/10/2014, 7:59 AM

## 2014-07-11 MED ORDER — OXYCODONE-ACETAMINOPHEN 5-325 MG PO TABS: 1.0000 | ORAL_TABLET | ORAL | Status: DC | PRN

## 2014-07-11 MED ORDER — IBUPROFEN 600 MG PO TABS: 600.0000 mg | ORAL_TABLET | Freq: Four times a day (QID) | ORAL | Status: DC

## 2014-07-11 MED ORDER — CEPHALEXIN 500 MG PO CAPS: 500.0000 mg | ORAL_CAPSULE | Freq: Three times a day (TID) | ORAL | Status: DC

## 2014-07-11 NOTE — Lactation Note (Addendum)
This note was copied from the chart of Amy [name]. Lactation Consultation Note  [name] Interpreter [phone] and on second visit In house interpreter present. Mother has been breastfeeding infant while here but now she is being discharged. [facility]  [facility] Reviewed pumping every three hours while away from infant, milk storage, labeling, cleaning and milk transportation. Provided mother with NICU booklet in spanish to read. Gave mother a hand pump and reviewed how to use.  [facility]. Encouraged her to call lactation for further assistance.     Patient Name: Amy [name] EAVWU'JToday's Date: 07/11/2014 Reason for consult: Follow-up assessment   Maternal Data    Feeding Feeding Type: Formula Nipple Type: Slow - flow Length of feed: 20 min  LATCH Score/Interventions Latch: Grasps breast easily, tongue down, lips flanged, rhythmical sucking.  Audible Swallowing: Spontaneous and intermittent  Type of Nipple: Everted at rest and after stimulation  Comfort (Breast/Nipple): Soft / non-tender     Hold (Positioning): No assistance needed to correctly position infant at breast.  LATCH Score: 10  Lactation Tools Discussed/Used Pump Review: Setup, frequency, and cleaning;Milk Storage   Consult Status Consult Status: Follow-up Date: 07/12/14 Follow-up type: In-patient    Dahlia ByesBerkelhammer, Ruth [facility] 07/11/2014, 2:52 PM

## 2014-07-11 NOTE — Discharge Instructions (Signed)

## 2014-07-11 NOTE — Discharge Summary (Signed)
Obstetric Discharge Summary Reason for Admission: onset of labor Prenatal Procedures: cerclage Intrapartum Procedures: spontaneous vaginal delivery and repair third degree laceration Postpartum Procedures: none Complications-Operative and Postpartum: perineal laceration HEMOGLOBIN  Date Value Ref Range Status  07/10/2014 7.6* 12.0 - 15.0 g/dL Final  16/10/960409/15/2015 54.010.3 g/dL Final   HCT  Date Value Ref Range Status  07/10/2014 23.0* 36.0 - 46.0 % Final  05/11/2014 30 % Final   Progress Notes by Tilda BurrowJohn Ferguson V, MD at 07/10/2014 6:43 AM    Author: Tilda BurrowJohn Ferguson V, MD Service: Obstetrics Author Type: Physician   Filed: 07/10/2014 6:52 AM Note Time: 07/10/2014 6:43 AM Status: Signed   Editor: Tilda BurrowJohn Ferguson V, MD (Physician)     Expand All Collapse All   Patient ID: Amy [name], female DOB: Apr 08, 1981, 33 y.o. MRN: [id number] Operative Delivery Note At 7:49 PM a viable and healthy female was delivered via VBAC, Vacuum Assisted. Presentation: vertex; Position: Occiput,, Anterior; Station: +3. No popoffs. Indication. Fetal bradycardia. Kiwi vacuum assist used thru 2 contractions, with easy descent of vertex down to crowning position, with vacuum taken off and pt allowed to push the baby the rest of the way to expulsion of the vertex.  Delivery of the head: 07/09/2014 7:48 PM First maneuver: 07/09/2014 7:48 PM, McRoberts Second maneuver: 07/09/2014 7:48 PM, Suprapubic Pressure Third maneuver: 07/09/2014 7:49 PM, Wood screw, using the left axilla to effect counterclockwise rotation of the fetal body to left shouder being out of introitus, when the left arm could be flexed at axilla, and released completely , then the rest of the body released, and baby taken promptly to the warmer and pediatrics called to the room for infant care. Fourth maneuver: ,  Fifth maneuver: ,  Sixth maneuver: ,   Verbal consent: obtained from patient.  APGAR: 5, 8; weight 9 lb 9.1 oz  (4340 g).  Placenta status: Intact, Spontaneous.  Cord: 3 vessels with the following complications: None. Cord pH: collected   Anesthesia: Epidural  Episiotomy: None Lacerations: 3rd degree Suture Repair: 2.0 vicryl Est. Blood Loss (mL): 600  Mom to mother baby. Baby to NICU.  FERGUSON,JOHN V 07/10/2014, 6:44 AM       Physical Exam:  General: alert, cooperative and no distress Lochia: appropriate Uterine Fundus: firm Incision: pain at laceration controled with med DVT Evaluation: No evidence of DVT seen on physical exam.  Discharge Diagnoses: Term Pregnancy-delivered and baby with clavicle fracture  Discharge Information: Date: 07/11/2014 Activity: pelvic rest Diet: routine Medications: Ibuprofen and Percocet Condition: stable Instructions: postpartum care spanish Discharge to: home Follow-up Information    Follow up with [facility]. Call in 6 weeks.   Contact information:   [address] [place] [address] [phone]       Newborn Data: Live born female  Birth Weight: 9 lb 9.1 oz (4340 g) APGAR: 5, 8  NICU  Amy [name] 07/11/2014, 7:46 AM

## 2014-07-11 NOTE — Progress Notes (Signed)
Assisted RN in NICU with update on baby feedings.  Spanish Interpreter - Joselyn GlassmanBenita Sanchez

## 2014-07-11 NOTE — Progress Notes (Signed)
Assisted Patient with ordering of dinner.  Assisted RN with interpretation of care and access to NICU.  Accompanied Pt to NICU for RN update on baby as well as interpretation of visitor regulations.  Spanish Interpreter - Joselyn GlassmanBenita Sanchez

## 2014-07-11 NOTE — Progress Notes (Signed)
Discharge instructions provided to patient at bedside.  Activity, medications, follow up appointments, when to call the doctor and community resources discussed.  No questions at this time.  Patient left unit in stable condition with all personal belongings, rented breast pump and prescriptions accompanied by staff. Interpreter utilized for all teaching and instructions.  Osvaldo AngstK. Inara Dike, RN------------------------

## 2014-07-12 ENCOUNTER — Encounter: Payer: Self-pay | Admitting: Obstetrics & Gynecology

## 2014-07-12 NOTE — Progress Notes (Signed)
Ur chart review completed.  

## 2014-07-27 ENCOUNTER — Ambulatory Visit (INDEPENDENT_AMBULATORY_CARE_PROVIDER_SITE_OTHER): Payer: Self-pay | Admitting: Obstetrics and Gynecology

## 2014-07-27 NOTE — Progress Notes (Signed)
SUBJECTIVE: [id number] about 2 wks PP here because "laceration opening up."  Had VAVD and shoulder dystocia 9#9oz with 3rd degree laceration repair. Out of all meds. Not constipated or straining but has pain with BM and wiping after voiding. Burning at perineum.  Breastfeeding well. Lochia rubra-pink and tapering. Denies F/C/abdominal pain, purulent vaginal discharge, UTI sx. Not sexually active.   OBJECTIVE: Filed Vitals:   07/27/14 1011  BP: 117/58  Temp: 97.9 F (36.6 C)    Gen: Obese HF, NAD, anxious Abd: Obese, soft, NT, involuting Breasts: lactational Perineum: midline induration along suture line from introitus to anal verge; 2 cm abraded area distally, tissue/tag  slightly over-riding; no draining, no surrounding erythema.  ASSESSMENT:/PLAN: S/p 3rd degree laceration, healing with some induration and abraded skin Advised sitz, Tucks and squirt after voiding and BM Rx Colace, lidocaine gel, ibuprofen  Keep F/U PP appointment Danae Orleanseirdre C Ayaan Shutes, CNM 07/27/2014 10:58 AM

## 2014-07-27 NOTE — Progress Notes (Signed)
Pt states that her episiotomy is opening up.

## 2014-07-27 NOTE — Patient Instructions (Signed)
Bao de Asiento Buyer, retail([place]) Un bao de asiento es un bao de agua caliente tomado en posicin de sentado que slo cubre las caderas y Reydonnalgas. Puede utilizarse para propsitos de higiene o curacin. Los baos de asiento a menudo se Insurance risk surveyorutilizan para Engineer, materialsaliviar el dolor, picazn o espasmos musculares. El agua podr contener Tech Data Corporationalguna medicacin. El calor hmedo lo ayudar a curarse y relajarse. INSTRUCCIONES PARA EL CUIDADO DOMICILIARIO Tome de 3 a 4 baos de Insurance claims handlerasiento por da. 1. Llene la baadera hasta la mitad con agua caliente. 2. Sintese en el agua y abra un poco el desage. 3. Ladell PierAbra el agua caliente para mantener la baadera llena hasta la mitad. Deje el agua corriendo de Red Cliffmanera constante. 4. Sumrjase en el agua por 15 a 20 minutos. 5. Luego del bao de asiento, seque primero la parte afectada. SOLICITE ATENCIN MDICA SI: Si se siente peor en vez de mejorar. Interrumpa los baos de Westernportasiento. ASEGRESE DE QUE:   Comprende estas instrucciones.  Controlar su enfermedad.  Solicitar ayuda de inmediato si no mejora o si empeora. Document Released: 09/24/2006 Document Revised: 05/07/2012 [facility] Patient Information 2015 [place], [place]. This information is not intended to replace advice given to you by your health care provider. Make sure you discuss any questions you have with your health care provider.

## 2014-07-28 ENCOUNTER — Other Ambulatory Visit: Payer: Self-pay

## 2014-07-28 MED ORDER — LIDOCAINE 0.5 % EX GEL: CUTANEOUS | Status: DC

## 2014-07-28 MED ORDER — IBUPROFEN 600 MG PO TABS: 600.0000 mg | ORAL_TABLET | Freq: Four times a day (QID) | ORAL | Status: DC

## 2014-07-28 NOTE — Telephone Encounter (Signed)
Pt came to window and stated, with Spanish interpreter Genella RifeSilvia, that the provider she saw yesterday was suppose to had ordered a gel and refill on ibuprofen.  Pt stated that she went to her pharmacy and the Rx's was not there. Looking at pt's chart, she saw Leola Brazileidre Poe, CNM on 07/27/14 and Deidre was to order lidocaine gel and refill her ibuprofen 600 mg.  Per Dr. Jolayne Pantheronstant, I can refill her ibuprofen and order lidocaine 0.5% gel.  I advised pt to her pharmacy an hour and to call to make sure that it is there before going to pick it up.  Pt stated understanding.

## 2014-08-30 ENCOUNTER — Ambulatory Visit (INDEPENDENT_AMBULATORY_CARE_PROVIDER_SITE_OTHER): Payer: Self-pay | Admitting: Family Medicine

## 2014-08-30 ENCOUNTER — Encounter: Payer: Self-pay | Admitting: Family Medicine

## 2014-08-30 MED ORDER — IBUPROFEN 600 MG PO TABS: 600.0000 mg | ORAL_TABLET | Freq: Four times a day (QID) | ORAL | Status: DC

## 2014-08-30 NOTE — Progress Notes (Addendum)
  Subjective:     Amy [name] is a 34 y.o. female who presents for a postpartum visit. She is 6 weeks postpartum following a spontaneous vaginal delivery. I have fully reviewed the prenatal and intrapartum course. The delivery was at 40 gestational weeks. Outcome: spontaneous vaginal delivery. Anesthesia: epidural. Postpartum course has been normal. Baby's course has been normal. Baby is feeding by breast. Bleeding no bleeding. Bowel function is normal. Bladder function is normal. Patient is not sexually active. Contraception method is none. Postpartum depression screening: negative.  The following portions of the patient's history were reviewed and updated as appropriate: allergies, current medications, past family history, past medical history, past social history, past surgical history and problem list.  Review of Systems Pertinent items are noted in HPI.   Objective:    BP 113/50 mmHg  Pulse 70  Temp(Src) 97.6 F (36.4 C) (Oral)  Ht  (1.575 m)  Wt 149 lb 1.6 oz (67.631 kg)  BMI 27.26 kg/m2  Breastfeeding? Yes  General:  alert, cooperative and no distress   Breasts:  inspection negative, no nipple discharge or bleeding, no masses or nodularity palpable  Slightly firm area in left outer breast  Lungs: clear to auscultation bilaterally  Heart:  regular rate and rhythm, S1, S2 normal, no murmur, click, rub or gallop  Abdomen: soft, non-tender; bowel sounds normal; no masses,  no organomegaly   Vulva:  normal  Vagina: normal vagina, no discharge, exudate, lesion, or erythema  Cervix:  multiparous appearance        Assessment:     normal postpartum exam.  Clogged milk duct    Plan:    1. Contraception: Depo-Provera injections at health department 2. Warm compresses for left breast, breast feed on left breast first - mastitis precautions given. 3.  Follow up in: 1 year or as needed.

## 2014-08-30 NOTE — Progress Notes (Signed)
Spanish interpreter Amy [name]

## 2015-03-26 ENCOUNTER — Emergency Department (INDEPENDENT_AMBULATORY_CARE_PROVIDER_SITE_OTHER)
Admission: EM | Admit: 2015-03-26 | Discharge: 2015-03-26 | Disposition: A | Discharge status: Home / Self Care | Payer: Self-pay | Source: Home / Self Care | Attending: Family Medicine | Admitting: Family Medicine

## 2015-03-26 ENCOUNTER — Encounter (HOSPITAL_COMMUNITY): Payer: Self-pay | Admitting: Emergency Medicine

## 2015-03-26 DIAGNOSIS — N61 Inflammatory disorders of breast: Secondary | ICD-10-CM

## 2015-03-26 MED ORDER — CEPHALEXIN 500 MG PO CAPS: 500.0000 mg | ORAL_CAPSULE | Freq: Four times a day (QID) | ORAL | Status: AC

## 2015-03-26 NOTE — ED Provider Notes (Signed)
CSN: [id number]     Arrival date & time 03/26/15  1416 History   First MD Initiated Contact with Patient 03/26/15 1452     Chief Complaint  Patient presents with  . Breast Pain   (Consider location/radiation/quality/duration/timing/severity/associated sxs/prior Treatment) HPI Comments: Patient presents with pain to left breast and nipple region. Possible mild discharge. Warm to touch and red. Feels "cold" with "chills". No objective fever is noted. Pain with breast feeding. No n, V. No previous past history.   The history is provided by the patient.    Past Medical History  Diagnosis Date  . Medical history non-contributory    Past Surgical History  Procedure Laterality Date  . Cesarean section      one previous  . Cervical cerclage N/A 01/13/2014    Procedure: CERCLAGE CERVICAL;  Surgeon: Adam Phenix, MD;  Location: [facility];  Service: Gynecology;  Laterality: N/A;   Family History  Problem Relation Age of Onset  . Diabetes Father   . Diabetes Sister    History  Substance Use Topics  . Smoking status: Never Smoker   . Smokeless tobacco: Never Used  . Alcohol Use: No   OB History    Gravida Para Term Preterm AB TAB SAB Ectopic Multiple Living   0 1  1  0 4     Review of Systems  All other systems reviewed and are negative.   Allergies  Penicillins  Home Medications   Prior to Admission medications   Medication Sig Start Date End Date Taking? Authorizing Provider  cephALEXin (KEFLEX) 500 MG capsule Take 1 capsule (500 mg total) by mouth 4 (four) times daily. 03/26/15 03/30/15  Riki Sheer, PA-C  ibuprofen (ADVIL,MOTRIN) 600 MG tablet Take 1 tablet (600 mg total) by mouth every 6 (six) hours. 08/30/14   Levie Heritage, DO  Lidocaine 0.5 % GEL Apply to affected area three times daily PRN 07/28/14   Catalina Antigua, MD  oxyCODONE-acetaminophen (PERCOCET/ROXICET) 5-325 MG per tablet Take 1 tablet by mouth every 4 (four) hours as needed (for pain scale less than  7). 07/11/14   Adam Phenix, MD  Prenatal Vit-Fe Fumarate-FA (PRENATAL VITAMINS PLUS) 27-1 MG TABS Take 1 tablet by mouth daily.    Historical Provider, MD   BP 104/63 mmHg  Pulse 75  Temp(Src) 97.3 F (36.3 C) (Oral)  Resp 18  SpO2 97%  Breastfeeding? Yes Physical Exam  Constitutional: She is oriented to person, place, and time. She appears well-developed and well-nourished. No distress.  HENT:  Head: Normocephalic and atraumatic.  Pulmonary/Chest: Effort normal.  Genitourinary: There is breast tenderness. No breast swelling or discharge.  Left breast with mild warmth para nipple. Pain with palpation along the nipple region. No discharge noted. Mild erythema. Otherwise normal breast exam.   Neurological: She is alert and oriented to person, place, and time.  Skin: She is not diaphoretic.  Psychiatric: Her behavior is normal.  Nursing note and vitals reviewed.   ED Course  Procedures (including critical care time) Labs Review Labs Reviewed - No data to display  Imaging Review No results found.   MDM   1. Mastitis, acute    Education given. Treat with Keflex (has taken before safely). Cold compresses. Break from breast feeding until improved with use of NSAIDs while not lactating. F/U if worsens.     Riki Sheer, PA-C 03/26/15 [phone]

## 2015-03-26 NOTE — Discharge Instructions (Signed)
Mastitis  (Mastitis) La mastitis es una inflamacin en el tejido Leetonmamario. Generalmente ocurre en las mujeres que San Jacintoamamantan, pero tambin puede afectar a otras mujeres y, en algunos [place]os, a los hombres. CAUSAS  Generalmente la causa de la mastitis es una infeccin bacteriana. Las bacterias ingresan al tejido mamario travs de cortes o grietas en la piel. Generalmente esto ocurre al QUALCOMMamamantar, debido a las grietas o irritacin de la piel. En algunos casos puede ocurrir an cuando no Sports coachhaya grietas en la piel. Tambin se relaciona con la obstruccin de los conductos por los que Product/process development scientistsale la leche (lactferos). Un piercing en los pezones puede ocasionar una mastitis. Adems, algunas formas de cncer de mama pueden causar mastitis. SIGNOS Y SNTOMAS   Hinchazn, enrojecimiento, sensibilidad y dolor en la zona de la mama.  Hinchazn de los ganglios que se encuentran debajo del brazo, en el mismo lado.  Grant RutsFiebre. Si se permite que la infeccin progrese, podr formarse una acumulacin de pus (absceso). DIAGNSTICO  El mdico podr hacer el diagnstico de mastitis en base a sus sntomas y el examen fsico. Le indicarn estudios para confirmar el diagnstico. Estos pueden ser:   Extraccin del pus de la mama, aplicando presin en la zona. El pus se examinar en el laboratorio para determinar de qu bacteria se trata. Si hay un absceso, podrn retirarle el lquido con Portugaluna aguja. Con el lquido se confirmar el diagnstico y se Production assistant, radiodeterminar la bacteria que causa el problema. En la International Business Machinesmayora de los casos no se observa pus.  Le solicitarn anlisis de sangre para determinar si su organismo est luchando contra una infeccin bacteriana.  Una mamografa o una ecografa podrn descartar otros problemas o enfermedades. TRATAMIENTO  Antibiticos para combatir la infeccin bacteriana. El Nurse, children'sprofesional determinar qu bacteria es la que est causando la infeccin y Art gallery managerseleccionar el tipo de antibitico ms adecuado. Podr  cambiarlo segn el resultado del cultivo o si la respuesta al antibitico no es la [place] May[place] Islandsadecuada. Los antibiticos se administran por va oral. Tambin le recetar medicamentos para Chief Technology Officerel dolor. La mastitis que se produce debido al amamantamiento podr mejorar sin tratamiento, por lo tanto el mdico podr indicarle que espere 24 horas despus de verla por primera vez para decidir si necesita recetarle un medicamento. INSTRUCCIONES PARA EL CUIDADO EN EL HOGAR   Slo tome medicamentos de venta libre o recetados para Primary school teachercalmar el dolor, Environmental health practitionerel malestar o bajar la fiebre, segn las indicaciones de su mdico.  Si su mdico le receta antibiticos, tmelos tal First Data Corporationcomo le indic. Asegrese de que finaliza la prescripcin completa aunque se sienta mejor.  No use un sostn demasiado ajustado o con aro. Use un sostn blando, de soporte.  Aumente la ingestin de lquidos, especialmente si tiene fiebre.  Las mujeres que CDW Corporationamamantan deben seguir las siguientes indicaciones:  Regulatory affairs officerAmamante hasta vaciar la mama. El Ecologistprofesional le informar si su leche es segura para el beb o debe descartarla. Podrn indicarle que deje de Avery Dennisonamamantar hasta que el mdico considere que es seguro para su beb. Use un sacaleche si le aconsejan dejar de amamantar.  Mantenga los pezones secos y limpios.  Vace la primera mama completamente antes de amamantar con la segunda. Si el beb no vaca la mama por algn motivo, utilice un sacaleche.  Si debe regresar a Engineer, watersu empleo, use un sacaleche en el horario de trabajo para State Street Corporationmantener los horarios.  Evite que las [address] D[address]amas se llenen mucho de Bishopleche (congestin) SOLICITE ATENCIN MDICA SI:   Shelle Ironiene una secrecin similar a pus por  la mama.  Los sntomas no mejoran con el tratamiento indicado por su mdico dentro de los [address]  [address]. SOLICITE ATENCIN MDICA DE INMEDIATO SI:   El dolor y la hinchazn empeoran.  Aumenta el dolor y no puede controlarlo con Tourist information centre manager.  Observa una lnea roja que se extiende desde la  mama hasta la axila.  Tiene fiebre o sntomas persistentes durante ms de 2 - 3 das.  Tiene fiebre y los sntomas empeoran repentinamente. Document Released: 05/23/2005 Document Revised: 08/18/2013 [facility] Patient Information 2015 [place], [place]. This information is not intended to replace advice given to you by your health care provider. Make sure you discuss any questions you have with your health care provider.     You have an infection in the glands of the breast. Will give you antibiotics for this. You can take Keflex which you have had in the past. Should empty the breast for relief and use cold compresses for relief. If not breast feeding may use Aleve 2x a day for pain and inflammation. Not to take while lactation is resumed.

## 2015-03-26 NOTE — ED Notes (Signed)
Pt reports left breast/nipple tenderness onset yest associated w/fevers, BA, chills Hurts to breast feed... Taking tyle  w/temp relief Denies nipple d/c Alert, no signs of acute distress.

## 2015-07-21 IMAGING — US US OB TRANSVAGINAL
1 series · 13 of 28 positions shown · non-contrast
Comparison: US OB TRANSVAGINAL dated 12/10/2013; US OB TRANSVAGINAL
dated 05/09/2010

CLINICAL DATA: Early pregnancy with vaginal bleeding. Last
menstrual period is approximately 10/12/2013.

EXAM:
OBSTETRIC <14 WK US AND TRANSVAGINAL OB US
TECHNIQUE: Both transabdominal and transvaginal ultrasound examinations were
performed for complete evaluation of the gestation as well as the
maternal uterus, adnexal regions, and pelvic cul-de-sac.
Transvaginal technique was performed to assess early pregnancy.

[Series 1: us ob comp less 14 wks · 50 acquisitions, 13 frames shown]
[im 2/50]
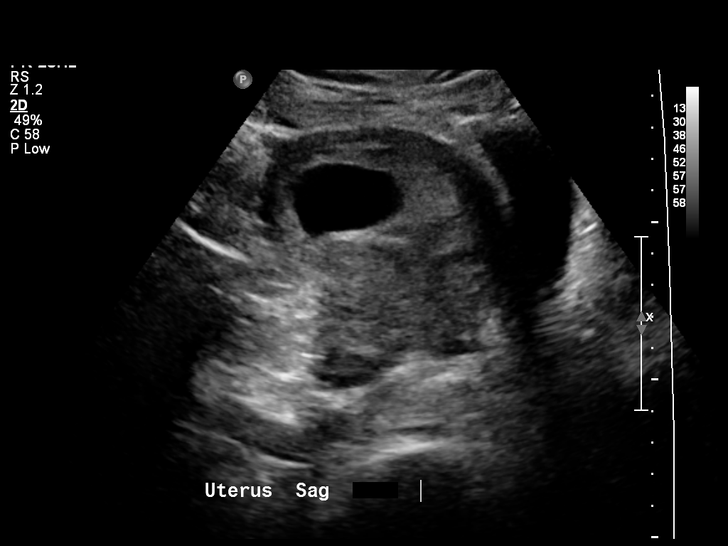
[im 6/50]
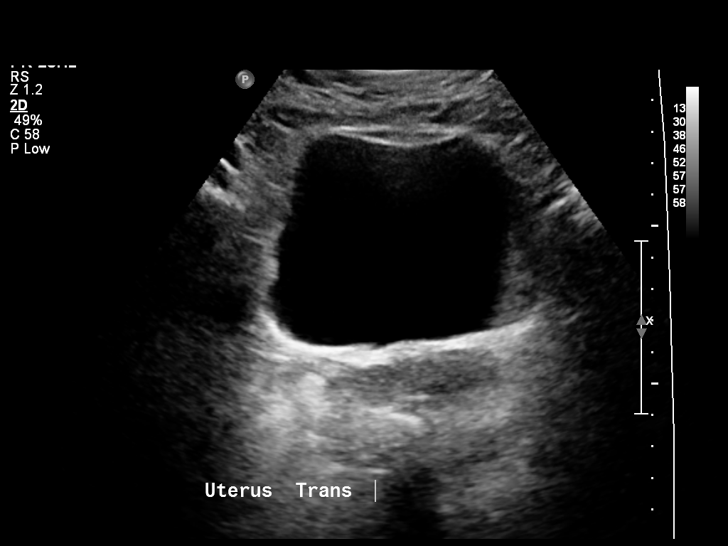
[im 10/50]
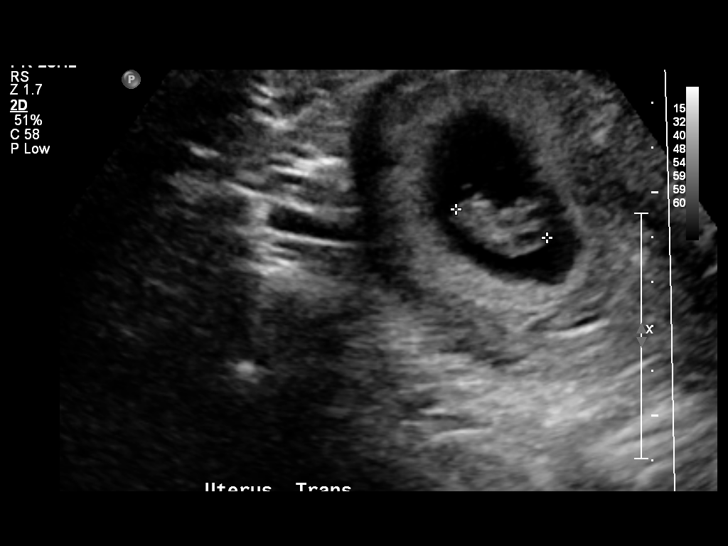
[im 13/50]
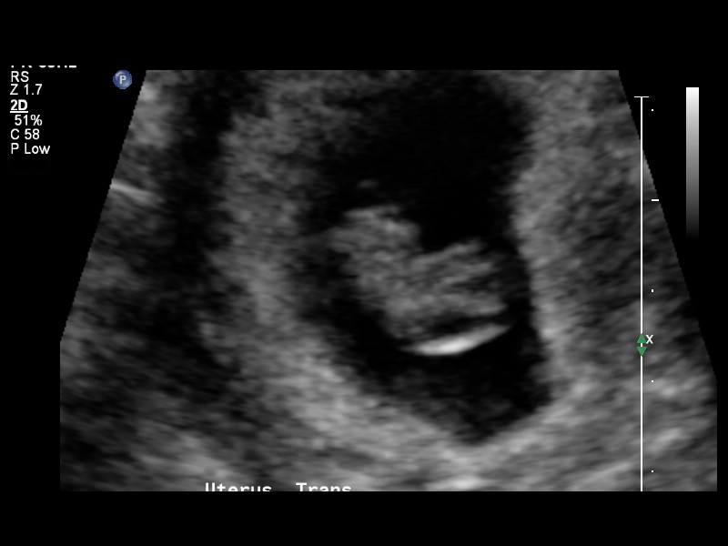
[im 17/50]
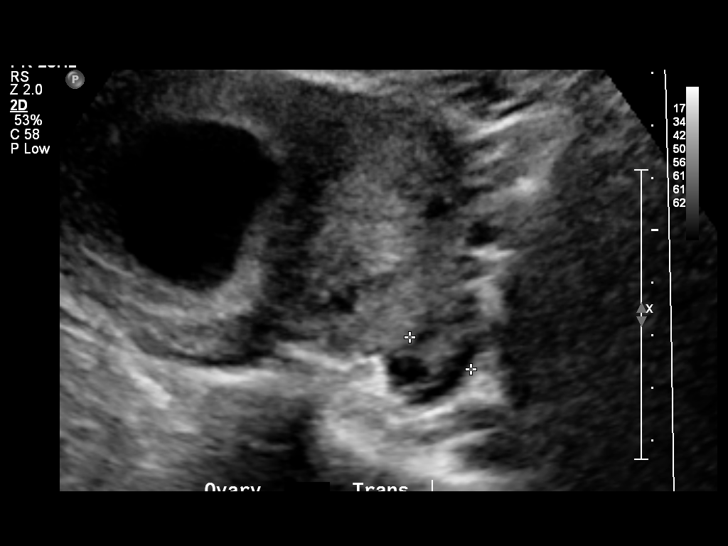
[im 20/50]
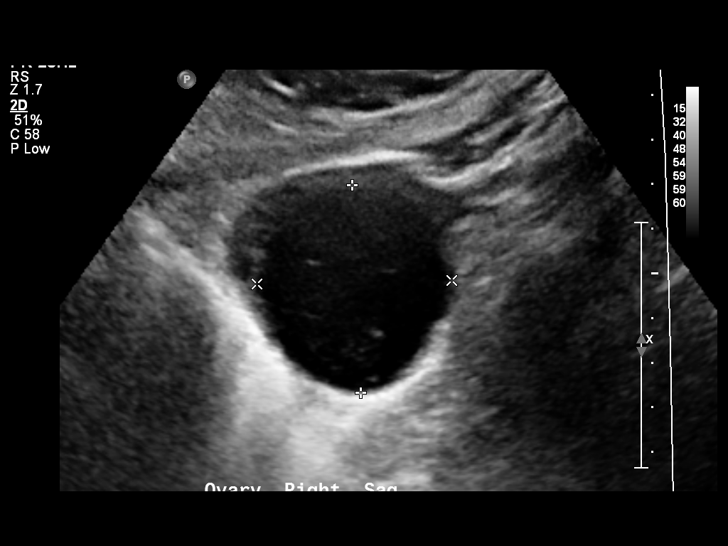
[im 26/50]
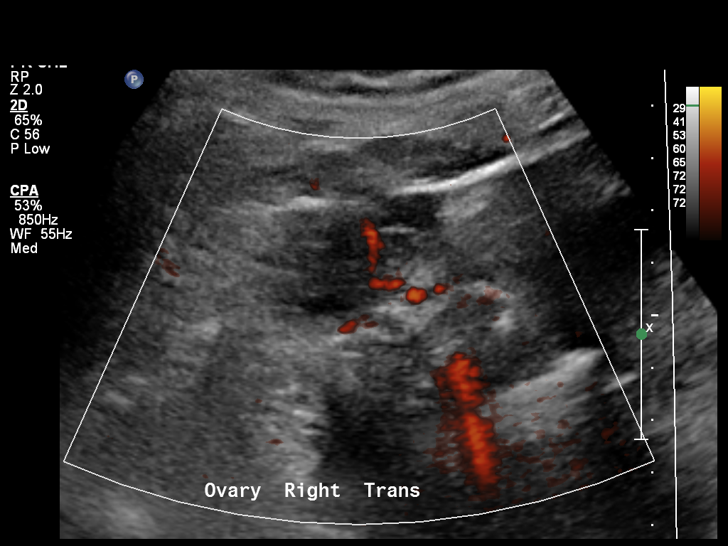
[im 30/50]
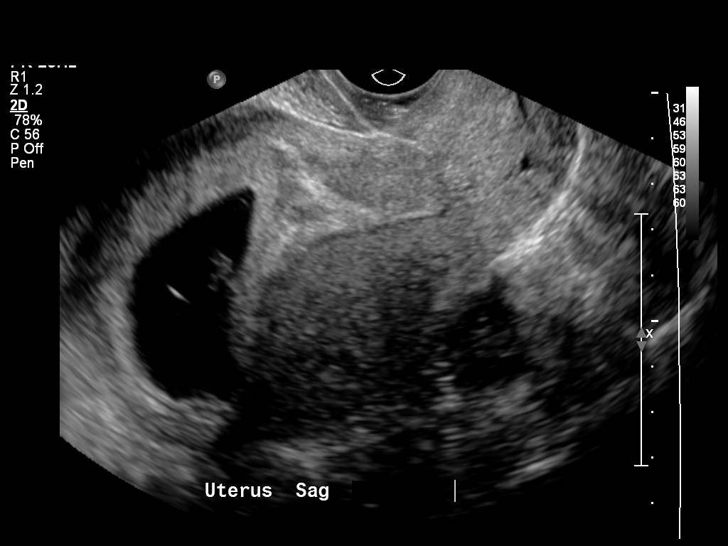
[im 33/50]
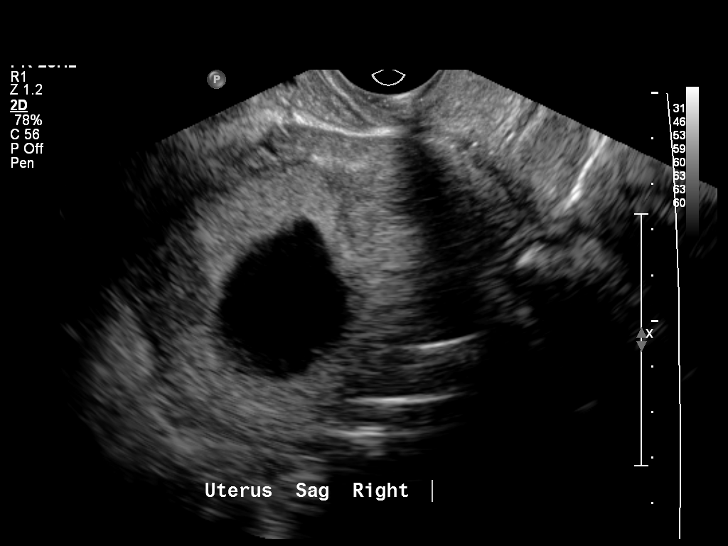
[im 37/50]
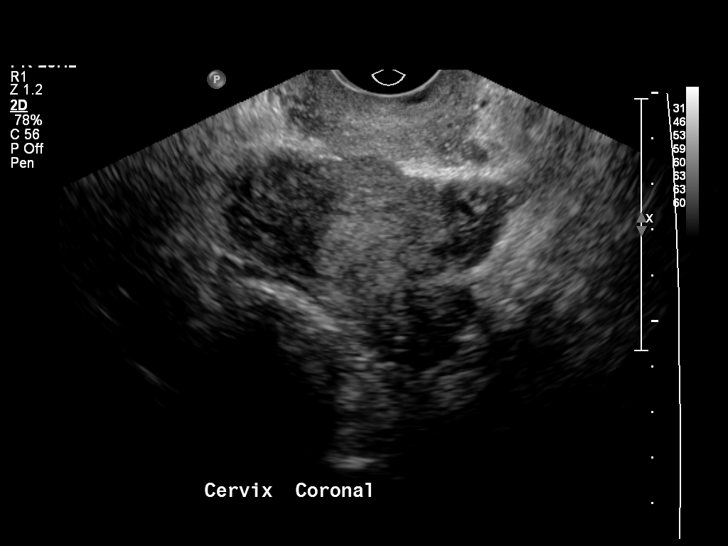
[im 40/50]
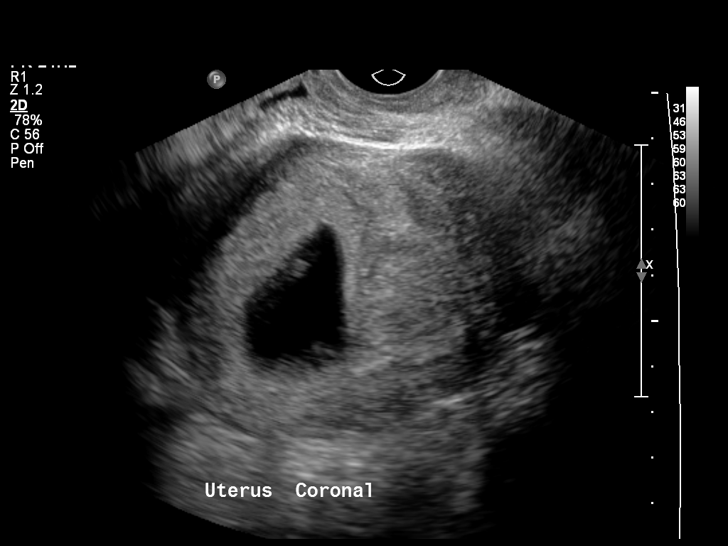
[im 44/50]
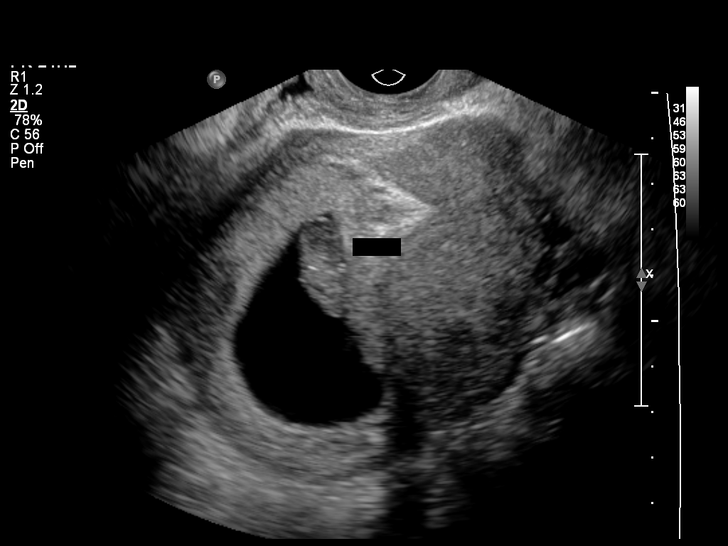
[im 48/50]
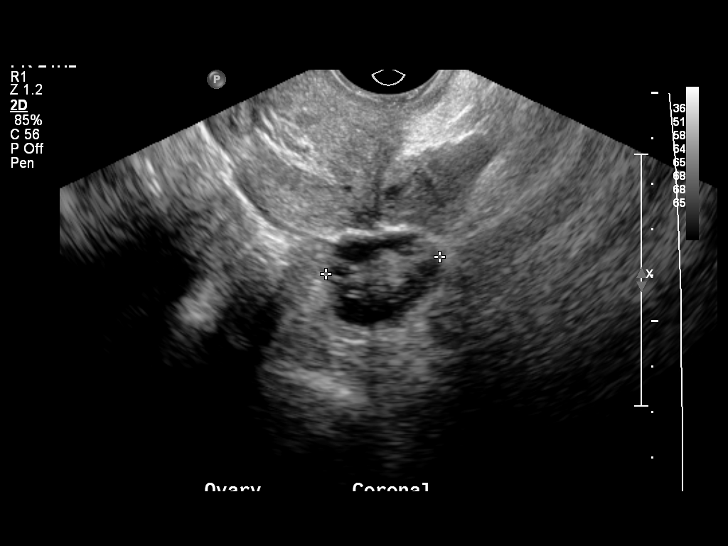

[13 of 28 positions shown; findings below may reference images not displayed]

FINDINGS: Intrauterine gestational sac: Normal intrauterine gestational sac is
visualized.

Yolk sac:  Normal yolk sac visualized.

Embryo:  Single embryo visualize.

Cardiac Activity: Regular cardiac activity detected.

Heart Rate:  165 bpm

MSD:    mm    w     d

CRL:   22  mm   9 w 0 d                  US EDC: 07/15/2014

No subchorionic hemorrhage is identified. No free fluid is
identified in the pelvis.

Maternal uterus/adnexae: Cyst of the right ovary measures
approximately 5 cm in contains some mild internal echogenicity.
IMPRESSION: Single viable intrauterine gestation with estimated gestational age
of 9 weeks and 0 days by ultrasound. Estimated EDC is 07/15/2014.
Mildly complex 5 cm right adnexal cyst identified.

## 2015-07-27 ENCOUNTER — Encounter (HOSPITAL_COMMUNITY): Payer: Self-pay | Admitting: *Deleted

## 2015-07-27 ENCOUNTER — Emergency Department (INDEPENDENT_AMBULATORY_CARE_PROVIDER_SITE_OTHER)
Admission: EM | Admit: 2015-07-27 | Discharge: 2015-07-27 | Disposition: A | Discharge status: Home / Self Care | Payer: Self-pay | Source: Home / Self Care | Attending: Family Medicine | Admitting: Family Medicine

## 2015-07-27 DIAGNOSIS — L249 Irritant contact dermatitis, unspecified cause: Secondary | ICD-10-CM

## 2015-07-27 MED ORDER — FLUTICASONE PROPIONATE 0.005 % EX OINT: 1.0000 "application " | TOPICAL_OINTMENT | Freq: Two times a day (BID) | CUTANEOUS | Status: DC

## 2015-07-27 NOTE — ED Notes (Signed)
Pt  Reports  Chronic  Problem  With  The  l  Middle  Finger     Redness   And  Tenderness     X  sev  Months    Pt  Reports  She works  With  Her  Hands  Cleaning    And  Uses  Chemicals

## 2015-07-27 NOTE — ED Provider Notes (Signed)
CSN: [id number]     Arrival date & time 07/27/15  1522 History   First MD Initiated Contact with Patient 07/27/15 1636     Chief Complaint  Patient presents with  . Hand Pain   (Consider location/radiation/quality/duration/timing/severity/associated sxs/prior Treatment) Patient is a 34 y.o. female presenting with hand pain. The history is provided by the patient.  Hand Pain This is a chronic problem. Episode onset: 3mos. The problem has been gradually worsening.    Past Medical History  Diagnosis Date  . Medical history non-contributory    Past Surgical History  Procedure Laterality Date  . Cesarean section      one previous  . Cervical cerclage N/A 01/13/2014    Procedure: CERCLAGE CERVICAL;  Surgeon: Adam PhenixJames G Arnold, MD;  Location: [facility];  Service: Gynecology;  Laterality: N/A;   Family History  Problem Relation Age of Onset  . Diabetes Father   . Diabetes Sister    Social History  Substance Use Topics  . Smoking status: Never Smoker   . Smokeless tobacco: Never Used  . Alcohol Use: No   OB History    Gravida Para Term Preterm AB TAB SAB Ectopic Multiple Living   5 4 4  0 1  1  0 4     Review of Systems  Skin: Positive for rash.  All other systems reviewed and are negative.   Allergies  Penicillins  Home Medications   Prior to Admission medications   Medication Sig Start Date End Date Taking? Authorizing Provider  fluticasone (CUTIVATE) 0.005 % ointment Apply 1 application topically 2 (two) times daily. 07/27/15   Linna HoffJames D Gwendlyon Zumbro, MD  ibuprofen (ADVIL,MOTRIN) 600 MG tablet Take 1 tablet (600 mg total) by mouth every 6 (six) hours. 08/30/14   Levie HeritageJacob J Stinson, DO  Lidocaine 0.5 % GEL Apply to affected area three times daily PRN 07/28/14   Catalina AntiguaPeggy Constant, MD  oxyCODONE-acetaminophen (PERCOCET/ROXICET) 5-325 MG per tablet Take 1 tablet by mouth every 4 (four) hours as needed (for pain scale less than 7). 07/11/14   Adam PhenixJames G Arnold, MD  Prenatal Vit-Fe Fumarate-FA  (PRENATAL VITAMINS PLUS) 27-1 MG TABS Take 1 tablet by mouth daily.    Historical Provider, MD   Meds Ordered and Administered this Visit  Medications - No data to display  There were no vitals taken for this visit. No data found.   Physical Exam  Constitutional: She is oriented to person, place, and time. She appears well-developed and well-nourished. No distress.  Neurological: She is alert and oriented to person, place, and time.  Skin: Skin is warm and dry. Rash noted. There is erythema.  Cracking peeling , nonpustular, pruritic dermatitis of distal phalanges of mult fingers.  Nursing note and vitals reviewed.   ED Course  Procedures (including critical care time)  Labs Review Labs Reviewed - No data to display  Imaging Review No results found.   Visual Acuity Review  Right Eye Distance:   Left Eye Distance:   Bilateral Distance:    Right Eye Near:   Left Eye Near:    Bilateral Near:         MDM   1. Irritant contact hand eczema       Linna HoffJames D Braden Deloach, MD 07/27/15 [phone]

## 2015-08-03 ENCOUNTER — Ambulatory Visit (INDEPENDENT_AMBULATORY_CARE_PROVIDER_SITE_OTHER): Payer: Self-pay | Admitting: Obstetrics & Gynecology

## 2015-08-03 VITALS — BP 110/59 | HR 70 | Temp 98.6°F | Ht 59.0 in | Wt 144.6 lb

## 2015-08-03 DIAGNOSIS — N920 Excessive and frequent menstruation with regular cycle: Secondary | ICD-10-CM

## 2015-08-03 NOTE — Progress Notes (Signed)
Patient ID: Amy [name], female   DOB: [birth year], 34 y.o.   MRN: [id number]  Chief Complaint  Patient presents with  . Menorrhagia  States had heavy bleeding that started 8 days ago, stopped yesterday, now just spotting. Used interpreter Viviana SimplerAlis Herrera.   HPI Amy [name] is a 34 y.o. female.  [id number] Patient's last menstrual period was 07/24/2015. Regular menses but her last episode was heavy and lasted 8 days. No BC, wants to conceive   HPI  Past Medical History  Diagnosis Date  . Medical history non-contributory     Past Surgical History  Procedure Laterality Date  . Cesarean section      one previous  . Cervical cerclage N/A 01/13/2014    Procedure: CERCLAGE CERVICAL;  Surgeon: Adam PhenixJames G Servando Kyllonen, MD;  Location: [facility];  Service: Gynecology;  Laterality: N/A;    Family History  Problem Relation Age of Onset  . Diabetes Father   . Diabetes Sister     Social History Social History  Substance Use Topics  . Smoking status: Never Smoker   . Smokeless tobacco: Never Used  . Alcohol Use: No    Allergies  Allergen Reactions  . Penicillins Rash    Current Outpatient Prescriptions  Medication Sig Dispense Refill  . aspirin 325 MG tablet Take 325 mg by mouth every 4 (four) hours as needed.    . fluticasone (CUTIVATE) 0.005 % ointment Apply 1 application topically 2 (two) times daily. 30 g 1  . Ibuprofen (ADVIL) 200 MG CAPS Take 1 capsule by mouth as needed.     No current facility-administered medications for this visit.    Review of Systems Review of Systems  Constitutional: Negative.   Genitourinary: Positive for vaginal discharge (after intercourse only), menstrual problem and pelvic pain (low aback). Negative for vaginal bleeding.    Blood pressure 110/59, pulse 70, temperature 98.6 F (37 C), height 4\' 11"  (1.499 m), weight 144 lb 9.6 oz (65.59 kg), last menstrual period 07/24/2015, currently breastfeeding.  Physical Exam Physical  Exam  Constitutional: She is oriented to person, place, and time. She appears well-developed. No distress.  Pulmonary/Chest: Effort normal.  Genitourinary: Vagina normal and uterus normal. No vaginal discharge found.  Not tender no mass  Neurological: She is alert and oriented to person, place, and time.  Psychiatric: She has a normal mood and affect. Her behavior is normal.    Data Reviewed Postpartum note  Assessment    One episode of heavy period and cramps Wants to conceive Normal pelvic exam     Plan    Expectant management, notify if she still has problems or when she conceived        [name] 08/03/2015, 3:23 PM

## 2015-08-03 NOTE — Progress Notes (Signed)
States had heavy bleeding that started 8 days ago, stopped yesterday, now just spotting. Used interpreter Viviana SimplerAlis [name].

## 2015-08-03 NOTE — Patient Instructions (Signed)
Menorragia (Menorrhagia) Se llama menorragia a los perodos menstruales abundantes o que duran ms de lo habitual. En la menorragia, la prdida de sangre y los clicos en cada perodo pueden hacerle imposible seguir con sus actividades habituales. CAUSAS  En algunos casos, la causa de los perodos abundantes es desconocida, pero hay algunas afecciones que pueden causar menorragia. Las causas ms frecuentes son:  Un problema con la tiroides, que es la glndula productora de hormonas (hipotiroidismo).  Formaciones no cancerosas en el tero (plipos o fibromas).  Un desequilibrio entre las hormonas estrgeno y progesterona.  Uno de sus ovarios no libera vulos durante uno o ms meses.  Efectos secundarios por haberse colocado un dispositivo intrauterino (DIU).  Efectos secundarios por algunos medicamentos, como antiinflamatorios o anticoagulantes.  Trastornos hemorrgicos que impiden la correcta coagulacin. SIGNOS Y SNTOMAS  Durante un perodo normal, el sangrado dura entre 4 y 8 das. Los signos de que el perodo es muy abundante son:  De manera rutinaria tiene que cambiar el apsito o el tampn cada 1 o 2 horas debido a que est completamente empapado.  Elimina cogulos ms grandes de 1 pulgada (2,5 cm).  Tiene sangrado durante ms de 7 das.  Necesita usar apsitos y tampones al mismo tiempo porque pierde demasiada sangre.  Debe levantarse para cambiarse el apsito o el tampn durante la noche.  Tiene sntomas de anemia como cansancio, fatiga o falta de aire. DIAGNSTICO  El mdico le har un examen fsico y le har preguntas sobre sus sntomas y su historia menstrual. Podr indicarle otros estudios segn lo que encuentre durante el examen. Estos estudios pueden ser:  Anlisis de sangre. Los anlisis de sangre se usan para verificar si est embarazada o tiene cambios hormonales, un trastorno tiroideo o de sangrado, niveles bajos de hierro (anemia) u otros problemas.  Biopsia  de endometrio. El mdico tomar una muestra de tejido del interior del tero para que sea examinado con un microscopio.  Ecografa plvica. Este estudio utiliza ondas de sonido para tomar imgenes del tero, los ovarios y la vagina. Las imgenes pueden mostrar si tiene fibromas u otros crecimientos.  Histeroscopa. Para este estudio, el mdico usar un pequeo telescopio para mirar el interior del tero. Segn los resultados de los estudios iniciales, el mdico podr indicar ms estudios. TRATAMIENTO  Puede ser que no sea necesario un tratamiento mdico. Si lo necesita, el mdico primero podr recomendarle un tratamiento con uno o ms medicamentos. Si no se reduce el sangrado lo suficiente, el tratamiento quirrgico podra ser una opcin. El mejor tratamiento para usted depender de:   Si necesita evitar un embarazo.  Si desea tener hijos en el futuro.  La causa y la gravedad del sangrado.  Su opinin o preferencia personal. Algunos medicamentos para la menorragia son:  Mtodos anticonceptivos que contengan hormonas. Estos incluyen la pldora anticonceptiva, el parche en la piel, el anillo vaginal, las inyecciones que se aplican cada 3 meses, el DIU hormonal y el implante. Estos tratamientos reducen el sangrado durante el perodo menstrual.  Medicamentos que espesan la sangre y hacen ms lento el sangrado.  Medicamentos que reducen la inflamacin, como el ibuprofeno.  Medicamentos que contienen una hormona sinttica llamada progestina.  Medicamentos que hacen que los ovarios dejen de funcionar durante un breve lapso. Podra ser necesario un tratamiento quirrgico para la menorragia si los medicamentos no son eficaces. Las opciones de tratamiento incluyen:  Dilatacin y curetaje (D y C). En este procedimiento, el mdico abre (dilata) el cuello del tero   y luego raspa o succiona tejido del revestimiento interior del tero para reducir el sangrado menstrual.  Histeroscopa quirrgica. En  este procedimiento, se utiliza un pequeo tubo con una luz (histeroscopio) para observar la cavidad uterina y ayudar en la extirpacin quirrgica de un plipo que puede ser la causa de perodos abundantes.  Ablacin del endometrio. Por medio de diferentes tcnicas, el mdico destruye de manera permanente todo el revestimiento interno del tero (endometrio). Luego de la ablacin del endometrio, la mayora de las mujeres tienen escaso flujo menstrual, o no lo tienen. La ablacin del endometrio reduce la posibilidad de quedar embarazada.  Reseccin del endometrio. En este procedimiento quirrgico, se utiliza un asa de alambre electroquirrgica para extirpar el revestimiento interno del tero. Este procedimiento tambin reduce la posibilidad de quedar embarazada.  Histerectoma. La remocin quirrgica del tero y el cuello del tero es un procedimiento permanente que detiene los perodos menstruales. El embarazo no es posible luego de una histerectoma. Este procedimiento requiere de anestesia y hospitalizacin. INSTRUCCIONES PARA EL CUIDADO EN EL HOGAR   Tome solo medicamentos de venta libre o recetados, segn las indicaciones del mdico. Tome todos los medicamentos recetados exactamente como se le indic. No cambie ni reemplace los medicamentos sin consultarlo con el mdico.  Tome los comprimidos de hierro recetados, exactamente segn las indicaciones del mdico. Las hemorragias de larga duracin pueden traer como consecuencia una disminucin en los niveles de hierro. Los comprimidos de hierro ayudan a reponer el hierro que el organismo pierde luego de un sangrado abundante. El hierro puede causarle estreimiento. Si esto es un problema, aumente el consumo de salvado, frutas y fibra.  No tome aspirina ni medicamentos que contengan aspirina desde 1 semana antes ni durante el perodo menstrual. La aspirina puede hacer que la hemorragia empeore.  Si necesita cambiar el apsito o el tampn ms de una vez  cada 2horas, permanezca en cama y descanse todo lo posible hasta que la hemorragia se detenga.  Siga una dieta balanceada. Consuma alimentos ricos en hierro. Por ejemplo, vegetales de hoja verde, carne, hgado, huevos y panes y cereales de grano entero. No trate de perder peso hasta que la hemorragia anormal se detenga y los niveles de hierro en la sangre vuelvan a la normalidad. SOLICITE ATENCIN MDICA SI:   Empapa un tampn o un apsito cada 1 o 2 horas, y esto le ocurre cada vez que tiene el perodo.  Necesita usar apsitos y tampones al mismo tiempo porque pierde demasiada sangre.  Debe cambiarse el apsito o el tampn durante la noche.  Tiene un perodo que dura ms de 8 das.  Elimina cogulos de ms de 1 pulgada (2,5 cm).  Tiene perodos irregulares que ocurren ms o menos de una vez al mes.  Se siente mareada o se desmaya.  Se siente muy dbil o cansada.  Le falta el aire o siente que el corazn late muy rpido al hacer ejercicios.  Tiene nuseas y vmitos o diarrea mientras toma los medicamentos.  Tiene algn problema que puede estar relacionado con el medicamento que est tomando. SOLICITE ATENCIN MDICA DE INMEDIATO SI:   Empapa 4 o ms apsitos o tampones en 2 horas.  Tiene sangrado y est embarazada. ASEGRESE DE QUE:   Comprende estas instrucciones.  Controlar su afeccin.  Recibir ayuda de inmediato si no mejora o si empeora.   Esta informacin no tiene como fin reemplazar el consejo del mdico. Asegrese de hacerle al mdico cualquier pregunta que tenga.   Document Released:   05/23/2005 Document Revised: 08/18/2013 Elsevier Interactive Patient Education 2016 Elsevier Inc.  

## 2015-08-28 NOTE — L&D Delivery Note (Signed)
Upon entering room, patient had good maternal effort with contractions and delivered the head to the perineum. There was a noticeable "turtle sign" after delivery of head at perineum, without success after 3 pushes with a contraction, a shoulder dystocia was called. From time of recognition of shoulder dystocia to delivery of baby, took a total of 2 minutes, with 3 maneuvers: McRoberts, followed by Suprapubic pressure, followed by posterior arm delivery with successful vaginal delivery.    35 y.o. [id number] at 6857w2d delivered a viable female infant in cephalic, OA position restituted to LOA after shoulder dystocia (see above). No nuchal cord. Cord clamped x2 and cut, baby taken immediately to warmer to be evaluated by NICU team and NNT. Placenta delivered spontaneously intact, with 3VC. Fundus firm on exam with massage and pitocin. Good hemostasis noted.  Laceration: 3rd degree, class A (<50% EAS torn) Suture: 3-0 Vicryl and 3-0 Monocryl Good hemostasis noted.  Mom and baby recovering in LDR.    Apgars: 5/8 Weight: 8lb 6.4oz (3810g)    Jen MowElizabeth Mumaw, DO [facility] 05/30/2016, 9:27 AM

## 2015-10-18 ENCOUNTER — Encounter: Payer: Self-pay | Admitting: Obstetrics & Gynecology

## 2015-10-18 ENCOUNTER — Ambulatory Visit (INDEPENDENT_AMBULATORY_CARE_PROVIDER_SITE_OTHER): Payer: Self-pay

## 2015-10-18 DIAGNOSIS — Z3201 Encounter for pregnancy test, result positive: Secondary | ICD-10-CM

## 2015-10-18 LAB — POCT PREGNANCY, URINE: Preg Test, Ur: POSITIVE — AB

## 2015-10-18 NOTE — Progress Notes (Signed)
Pt here today for pregnancy test.  Resulting positive.  Pt states LMP 08/29/15, EDD 06/04/2016.  Pt provided with proof of pregnancy and starting prenatal care @ [facility].

## 2015-10-19 ENCOUNTER — Ambulatory Visit: Payer: Self-pay

## 2015-10-23 ENCOUNTER — Encounter (HOSPITAL_COMMUNITY): Payer: Self-pay | Admitting: *Deleted

## 2015-10-23 ENCOUNTER — Inpatient Hospital Stay (HOSPITAL_COMMUNITY)
Admission: AD | Admit: 2015-10-23 | Discharge: 2015-10-23 | Disposition: A | Discharge status: Home / Self Care | Payer: Self-pay | Source: Ambulatory Visit | Attending: Obstetrics & Gynecology | Admitting: Obstetrics & Gynecology

## 2015-10-23 ENCOUNTER — Inpatient Hospital Stay (HOSPITAL_COMMUNITY): Payer: Self-pay

## 2015-10-23 DIAGNOSIS — O209 Hemorrhage in early pregnancy, unspecified: Secondary | ICD-10-CM | POA: Insufficient documentation

## 2015-10-23 DIAGNOSIS — Z3A01 Less than 8 weeks gestation of pregnancy: Secondary | ICD-10-CM | POA: Insufficient documentation

## 2015-10-23 DIAGNOSIS — Z3491 Encounter for supervision of normal pregnancy, unspecified, first trimester: Secondary | ICD-10-CM

## 2015-10-23 DIAGNOSIS — O4691 Antepartum hemorrhage, unspecified, first trimester: Secondary | ICD-10-CM

## 2015-10-23 LAB — URINALYSIS, ROUTINE W REFLEX MICROSCOPIC
BILIRUBIN URINE: NEGATIVE
Glucose, UA: NEGATIVE mg/dL
Hgb urine dipstick: NEGATIVE
Ketones, ur: NEGATIVE mg/dL
LEUKOCYTES UA: NEGATIVE
NITRITE: NEGATIVE
Protein, ur: NEGATIVE mg/dL
SPECIFIC GRAVITY, URINE: 1.015 (ref 1.005–1.030)
pH: 8.5 — ABNORMAL HIGH (ref 5.0–8.0)

## 2015-10-23 LAB — WET PREP, GENITAL
SPERM: NONE SEEN
Trich, Wet Prep: NONE SEEN
Yeast Wet Prep HPF POC: NONE SEEN

## 2015-10-23 LAB — CBC
HEMATOCRIT: 37.4 % (ref 36.0–46.0)
HEMOGLOBIN: 13.1 g/dL (ref 12.0–15.0)
MCH: 29.7 pg (ref 26.0–34.0)
MCHC: 35 g/dL (ref 30.0–36.0)
MCV: 84.8 fL (ref 78.0–100.0)
Platelets: 211 10*3/uL (ref 150–400)
RBC: 4.41 MIL/uL (ref 3.87–5.11)
RDW: 12.8 % (ref 11.5–15.5)
WBC: 10.1 10*3/uL (ref 4.0–10.5)

## 2015-10-23 LAB — HCG, QUANTITATIVE, PREGNANCY: hCG, Beta Chain, Quant, S: 87153 m[IU]/mL — ABNORMAL HIGH (ref <5)

## 2015-10-23 NOTE — MAU Note (Signed)
Cramping in lower abd.  Small amt of bleeding;

## 2015-10-23 NOTE — MAU Provider Note (Signed)
Chief Complaint: Abdominal Pain and Vaginal Bleeding   First Provider Initiated Contact with Patient 10/23/15 1212      SUBJECTIVE HPI: Amy [name] is a 35 y.o. [id number] at [redacted]w[redacted]d by LMP who presents to maternity admissions reporting onset of cramping and light spotting when wiping today.  It has been intermittent, only when wiping and unchanged since onset 3 hours ago.  She reports the pain is mild and has not required any treatment.  It is not associated with other symptoms.  She denies vaginal itching/burning, urinary symptoms, h/a, dizziness, n/v, or fever/chills.     HPI  Past Medical History  Diagnosis Date  . Medical history non-contributory    Past Surgical History  Procedure Laterality Date  . Cesarean section      one previous  . Cervical cerclage N/A 01/13/2014    Procedure: CERCLAGE CERVICAL;  Surgeon: Adam Phenix, MD;  Location: [facility];  Service: Gynecology;  Laterality: N/A;   Social History   Social History  . Marital Status: Single    Spouse Name: N/A  . Number of Children: N/A  . Years of Education: N/A   Occupational History  . Not on file.   Social History Main Topics  . Smoking status: Never Smoker   . Smokeless tobacco: Never Used  . Alcohol Use: No  . Drug Use: No  . Sexual Activity: Yes    Birth Control/ Protection: None   Other Topics Concern  . Not on file   Social History Narrative   No current facility-administered medications on file prior to encounter.   Current Outpatient Prescriptions on File Prior to Encounter  Medication Sig Dispense Refill  . fluticasone (CUTIVATE) 0.005 % ointment Apply 1 application topically 2 (two) times daily. (Patient taking differently: Apply 1 application topically daily as needed. ) 30 g 1   Allergies  Allergen Reactions  . Penicillins Rash    Has patient had a PCN reaction causing immediate rash, facial/tongue/throat swelling, SOB or lightheadedness with hypotension: Yes Has patient had  a PCN reaction causing severe rash involving mucus membranes or skin necrosis: Yes Has patient had a PCN reaction that required hospitalization No Has patient had a PCN reaction occurring within the last 10 years: No If all of the above answers are "NO", then may proceed with Cephalosporin use.     ROS:  Review of Systems  Constitutional: Negative for fever, chills and fatigue.  Respiratory: Negative for shortness of breath.   Cardiovascular: Negative for chest pain.  Genitourinary: Positive for vaginal bleeding and pelvic pain. Negative for dysuria, flank pain, vaginal discharge, difficulty urinating and vaginal pain.  Neurological: Negative for dizziness and headaches.  Psychiatric/Behavioral: Negative.      I have reviewed patient's Past Medical Hx, Surgical Hx, Family Hx, Social Hx, medications and allergies.   Physical Exam   Patient Vitals for the past 24 hrs:  BP Temp Temp src Pulse Resp Height Weight  10/23/15 1053 96/62 mmHg 98 F (36.7 C) Oral 75 16 4' 11.5" (1.511 m) 152 lb 6.4 oz (69.128 kg)   Constitutional: Well-developed, well-nourished female in no acute distress.  Cardiovascular: normal rate Respiratory: normal effort GI: Abd soft, non-tender. Pos BS x 4 MS: Extremities nontender, no edema, normal ROM Neurologic: Alert and oriented x 4.  GU: Neg CVAT.  PELVIC EXAM: Cervix pink, visually closed, without lesion, scant tan/light brown discharge, vaginal walls and external genitalia normal Bimanual exam: Cervix 0/long/high, firm, anterior, neg CMT, uterus nontender, ~8 week size,  adnexa without tenderness, enlargement, or mass   LAB RESULTS Results for orders placed or performed during the hospital encounter of 10/23/15 (from the past 24 hour(s))  Urinalysis, Routine w reflex microscopic (not at [facility])     Status: Abnormal   Collection Time: 10/23/15 11:00 AM  Result Value Ref Range   Color, Urine YELLOW YELLOW   APPearance CLEAR CLEAR   Specific Gravity,  Urine 1.015 1.005 - 1.030   pH 8.5 (H) 5.0 - 8.0   Glucose, UA NEGATIVE NEGATIVE mg/dL   Hgb urine dipstick NEGATIVE NEGATIVE   Bilirubin Urine NEGATIVE NEGATIVE   Ketones, ur NEGATIVE NEGATIVE mg/dL   Protein, ur NEGATIVE NEGATIVE mg/dL   Nitrite NEGATIVE NEGATIVE   Leukocytes, UA NEGATIVE NEGATIVE  CBC     Status: None   Collection Time: 10/23/15 11:25 AM  Result Value Ref Range   WBC 10.1 4.0 - 10.5 K/uL   RBC 4.41 3.87 - 5.11 MIL/uL   Hemoglobin 13.1 12.0 - 15.0 g/dL   HCT 40.9 81.1 - 91.4 %   MCV 84.8 78.0 - 100.0 fL   MCH 29.7 26.0 - 34.0 pg   MCHC 35.0 30.0 - 36.0 g/dL   RDW 78.2 95.6 - 21.3 %   Platelets 211 150 - 400 K/uL  hCG, quantitative, pregnancy     Status: Abnormal   Collection Time: 10/23/15 11:25 AM  Result Value Ref Range   hCG, Beta Chain, Quant, S 08657 (H) <5 mIU/mL  Wet prep, genital     Status: Abnormal   Collection Time: 10/23/15 12:11 PM  Result Value Ref Range   Yeast Wet Prep HPF POC NONE SEEN NONE SEEN   Trich, Wet Prep NONE SEEN NONE SEEN   Clue Cells Wet Prep HPF POC PRESENT (A) NONE SEEN   WBC, Wet Prep HPF POC MODERATE (A) NONE SEEN   Sperm NONE SEEN        IMAGING US Ob Comp Less 14 Wks  10/23/2015  CLINICAL DATA:  35 year old pregnant female with pelvic pain and vaginal bleeding. Beta HCG of the 7,150. Estimated gestational age of [redacted] weeks 6 days by LMP. EXAM: OBSTETRIC <14 WK [place] AND TRANSVAGINAL OB US TECHNIQUE: Both transabdominal and transvaginal ultrasound examinations were performed for complete evaluation of the gestation as well as the maternal uterus, adnexal regions, and pelvic cul-de-sac. Transvaginal technique was performed to assess early pregnancy. COMPARISON:  None. FINDINGS: Intrauterine gestational sac: Visualized/normal in shape. Yolk sac:  Visualized Embryo:  Visualized Cardiac Activity: Visualized Heart Rate: 137  bpm CRL:  10  mm   7 w   1 d                  [place] EDC: 06/09/2016 Maternal uterus/adnexae: There is no evidence of  subchorionic hemorrhage. The ovaries are unremarkable. No free fluid or adnexal mass. IMPRESSION: Single living intrauterine gestation with estimated gestational age of [redacted] weeks 1 day by this ultrasound. No evidence of subchorionic hemorrhage. No significant abnormalities. Electronically Signed   By: Harmon Pier M.D.   On: 10/23/2015 13:21   US Ob Transvaginal  10/23/2015  CLINICAL DATA:  35 year old pregnant female with pelvic pain and vaginal bleeding. Beta HCG of the 7,150. Estimated gestational age of [redacted] weeks 6 days by LMP. EXAM: OBSTETRIC <14 WK [place] AND TRANSVAGINAL OB US TECHNIQUE: Both transabdominal and transvaginal ultrasound examinations were performed for complete evaluation of the gestation as well as the maternal uterus, adnexal regions, and pelvic cul-de-sac. Transvaginal technique was  performed to assess early pregnancy. COMPARISON:  None. FINDINGS: Intrauterine gestational sac: Visualized/normal in shape. Yolk sac:  Visualized Embryo:  Visualized Cardiac Activity: Visualized Heart Rate: 137  bpm CRL:  10  mm   7 w   1 d                  [place] EDC: 06/09/2016 Maternal uterus/adnexae: There is no evidence of subchorionic hemorrhage. The ovaries are unremarkable. No free fluid or adnexal mass. IMPRESSION: Single living intrauterine gestation with estimated gestational age of [redacted] weeks 1 day by this ultrasound. No evidence of subchorionic hemorrhage. No significant abnormalities. Electronically Signed   By: Harmon Pier M.D.   On: 10/23/2015 13:21    MAU Management/MDM: Ordered labs and reviewed results. Viable IUP confirmed on today's [place].  No evidence of [facility] or other indications for bleeding.  Likely bleeding from cervix, other benign cause.  Bleeding precautions given.  Pt stable at time of discharge.  ASSESSMENT 1. Normal IUP (intrauterine pregnancy) on prenatal ultrasound, first trimester   2. Vaginal bleeding in pregnancy, first trimester     PLAN Discharge home with bleeding precautions     Medication List    TAKE these medications        fluticasone 0.005 % ointment  Commonly known as:  CUTIVATE  Apply 1 application topically 2 (two) times daily.     prenatal multivitamin Tabs tablet  Take 1 tablet by mouth daily at 12 noon.       Follow-up Information    Please follow up.   Why:  Start prenatal care as soon as possible, Return to MAU as needed for emergencies      Sharen Counter Certified Nurse-Midwife 10/23/2015  1:29 PM

## 2015-10-23 NOTE — Discharge Instructions (Signed)
Hemorragia vaginal durante el embarazo (primer trimestre) (Vaginal Bleeding During Pregnancy, First Trimester) Durante los primeros meses del embarazo es relativamente frecuente que se presente una pequea hemorragia (manchas). Esta situacin generalmente mejora por s misma. Estas hemorragias o manchas tienen diversas causas al inicio del embarazo. Algunas manchas pueden estar relacionadas al [employer] y otras no. En la [employer], la hemorragia es normal y no es un problema. Sin embargo, la hemorragia tambin puede ser un signo de algo grave. Debe informar a su mdico de inmediato si tiene alguna hemorragia vaginal. Algunas causas posibles de hemorragia vaginal durante el primer trimestre incluyen:  Infeccin o inflamacin del cuello del tero.  Crecimientos anormales (plipos) en el cuello del tero.  Aborto espontneo o amenaza de aborto espontneo.  Tejido del Psychiatrist se ha desarrollado fuera del tero y en una trompa de falopio (embarazo ectpico).  Se han desarrollado pequeos quistes en el tero en lugar de tejido de embarazo (embarazo molar). INSTRUCCIONES PARA EL CUIDADO EN EL HOGAR  Controle su afeccin para ver si hay cambios. Las siguientes indicaciones ayudarn a Psychologist, educational [employer] pueda sentir:  Siga las indicaciones del mdico para restringir su actividad. Si el mdico le indica descanso en la cama, debe quedarse en la cama y levantarse solo para ir al bao. No obstante, el mdico puede permitirle que continu con tareas livianas.  Si es necesario, organcese para que alguien le ayude con las actividades y responsabilidades cotidianas mientras est en cama.  Lleve un registro de la cantidad y la saturacin de las toallas higinicas que Landscape architect. Anote este dato.  No use tampones.No se haga duchas vaginales.  No tenga relaciones sexuales u orgasmos hasta que el mdico la autorice.  Si elimina tejido por la vagina, gurdelo para mostrrselo al  [employer].  Lima solo medicamentos de venta libre o recetados, segn las indicaciones del mdico.  No tome aspirina, ya que puede causar hemorragias.  Cumpla con todas las visitas de control, segn le indique su mdico. SOLICITE ATENCIN MDICA SI:  Tiene un sangrado vaginal en cualquier momento del embarazo.  Tiene calambres o dolores de [place].  Tiene fiebre que los medicamentos no Sports coach. SOLICITE ATENCIN MDICA DE INMEDIATO SI:   Siente calambres intensos en la espalda o en el vientre (abdomen).  Elimina cogulos grandes o tejido por la vagina.  La hemorragia aumenta.  Si se siente mareada, dbil o se desmaya.  Tiene escalofros.  Tiene una prdida importante o sale lquido a borbotones por la vagina.  Se desmaya mientras defeca. ASEGRESE DE QUE:  Comprende estas instrucciones.  Controlar su afeccin.  Recibir ayuda de inmediato si no mejora o si empeora.   Esta informacin no tiene Theme park manager el consejo del mdico. Asegrese de hacerle al mdico cualquier pregunta que tenga.   Document Released: 05/23/2005 Document Revised: 08/18/2013 Elsevier Interactive Patient Education 2016 [employer].   Primer trimestre de Psychiatrist (First Trimester of Pregnancy) El primer trimestre de Psychiatrist se extiende desde la semana1 hasta el final de la semana12 (mes1 al mes3). Una semana despus de que un espermatozoide fecunda un vulo, este se implantar en la pared uterina. Este embrin comenzar a Camera operator convertirse en un beb. Sus genes y los de su pareja forman el beb. Los genes del varn determinan si ser un nio o una nia. Entre la semana6 y [place], se forman los ojos y [place], y los latidos del corazn pueden verse en la ecografa. Al final  de las 12semanas, todos los rganos del beb estn formados.  Ahora que est embarazada, querr hacer todo lo que est a su alcance para tener un beb sano. Dos de las cosas ms importantes son  Winferd Humphrey buena atencin prenatal y seguir las indicaciones del mdico. La atencin prenatal incluye toda la asistencia mdica que usted recibe antes del nacimiento del beb. Esta ayudar a prevenir, Engineer, manufacturing y tratar cualquier problema durante el embarazo y [place]. CAMBIOS EN EL ORGANISMO Su organismo atraviesa por muchos cambios durante el [place], y estos varan de Neomia Dear mujer a Educational psychologist.   Al principio, puede aumentar o bajar algunos kilos.  Puede tener Programme researcher, broadcasting/film/video (nuseas) y vomitar. Si no puede controlar los vmitos, llame al mdico.  Puede cansarse con facilidad.  Es posible que tenga dolores de cabeza que pueden aliviarse con los medicamentos que el mdico le permita tomar.  Puede orinar con mayor frecuencia. El dolor al orinar puede significar que usted tiene una infeccin de la vejiga.  Debido al Vanetta Mulders, puede tener acidez estomacal.  Puede estar estreida, ya que ciertas hormonas enlentecen los movimientos de los msculos que [employer] desechos a travs de los intestinos.  Pueden aparecer hemorroides o abultarse e hincharse las venas (venas varicosas).  Las [employer] pueden empezar a Government social research officer y Emergency planning/management officer. Los pezones pueden sobresalir ms, y el tejido que los rodea (areola) tornarse ms oscuro.  Las Veterinary surgeon y estar sensibles al cepillado y al hilo dental.  Pueden aparecer zonas oscuras o manchas (cloasma, mscara del Psychiatrist) en el rostro que probablemente se atenuarn despus del nacimiento del beb.  Los perodos menstruales se interrumpirn.  Tal vez no tenga apetito.  Puede sentir un fuerte deseo de consumir ciertos alimentos.  Puede tener cambios a Theatre manager a da, por ejemplo, por momentos puede estar emocionada por el Psychiatrist y por otros preocuparse porque algo pueda salir mal con el embarazo o el beb.  Tendr sueos ms vvidos y extraos.  Tal vez haya cambios en el cabello que pueden incluir su engrosamiento, crecimiento  rpido y cambios en la textura. A algunas mujeres tambin se les cae el cabello durante o despus del [place], o tienen el cabello seco o fino. Lo ms probable es que el cabello se le normalice despus del nacimiento del beb. QU DEBE ESPERAR EN LAS CONSULTAS PRENATALES Durante una visita prenatal de rutina:  La pesarn para asegurarse de que usted y el beb estn creciendo normalmente.  Le controlarn la presin arterial.  Le medirn el abdomen para controlar el desarrollo del beb.  Se escucharn los latidos cardacos a partir de la semana10 o la12 de embarazo, aproximadamente.  Se analizarn los resultados de los estudios solicitados en visitas anteriores. El mdico puede preguntarle:  Cmo se siente.  Si siente los movimientos del beb.  Si ha tenido sntomas anormales, como prdida de lquido, [place], dolores de cabeza intensos o clicos abdominales.  Si est consumiendo algn producto que contenga tabaco, como cigarrillos, tabaco de Theatre manager y Administrator, Civil Service.  Si tiene [employer]. Otros estudios que pueden realizarse durante el primer trimestre incluyen lo siguiente:  Anlisis de sangre para determinar el tipo de sangre y Engineer, manufacturing la presencia de infecciones previas. Adems, se los usar para controlar si los niveles de hierro son bajos (anemia) y Chief Strategy Officer los anticuerpos Rh. En una etapa ms avanzada del [place], se harn anlisis de sangre para saber si tiene diabetes, junto con otros estudios si surgen problemas.  Anlisis de  orina para detectar infecciones, diabetes o protenas en la orina.  Una ecografa para confirmar que el beb crece y se desarrolla correctamente.  Una amniocentesis para diagnosticar posibles problemas genticos.  Estudios del feto para descartar espina bfida y sndrome de [place].  Es posible que necesite otras pruebas adicionales.  Prueba del VIH (virus de inmunodeficiencia humana). Los exmenes prenatales de rutina incluyen  la prueba de deteccin del VIH, a menos que decida no Futures trader. INSTRUCCIONES PARA EL CUIDADO EN EL HOGAR  Medicamentos:  Siga las indicaciones del mdico en relacin con el uso de medicamentos. Durante el embarazo, hay medicamentos que pueden tomarse y otros que no.  Tome las vitaminas prenatales como se le indic.  Si est estreida, tome un laxante suave, si el mdico lo [place]. Dieta  Consuma alimentos balanceados. Elija alimentos variados, como carne o protenas de origen vegetal, pescado, leche y productos lcteos descremados, verduras, frutas y panes y Radiation protection practitioner. El mdico la ayudar a Production assistant, radio cantidad de peso que puede [place].  No coma carne cruda ni quesos sin cocinar. Estos elementos contienen bacterias que pueden causar defectos congnitos en el beb.  La ingesta diaria de cuatro o cinco comidas pequeas en lugar de tres comidas abundantes puede ayudar a [employer] nuseas y los vmitos. Si empieza a tener nuseas, comer algunas [address] puede ser de [place]. Beber lquidos [place] comidas en lugar de tomarlos durante las comidas tambin puede ayudar a Optician, dispensing las nuseas y los vmitos.  Si est estreida, consuma alimentos con alto contenido de [place], como verduras y frutas frescas, y Radiation protection practitioner. Beba suficiente lquido para Photographer orina clara o de color amarillo plido. Actividad y Landscape architect ejercicio solamente como se lo haya indicado el mdico. El ejercicio la ayudar a:  Art gallery manager.  Mantenerse en forma.  Estar preparada para el trabajo de parto y [place].  Los dolores, los clicos en la parte baja del abdomen o los calambres en la cintura son un buen indicio de que debe dejar de Corporate treasurer. Consulte al mdico antes de seguir haciendo ejercicios normales.  Intente no estar de pie [employer]. Mueva las piernas con frecuencia si debe estar de pie en un lugar durante mucho tiempo.  Evite levantar  pesos [employer].  Use zapatos de tacones bajos y [place].  Puede seguir teniendo [employer] [place] [employer], excepto que el mdico le indique lo contrario. Alivio del dolor o las molestias  Use un sostn que le brinde buen soporte si siente dolor a la palpacin [employer].  Dese baos de asiento con agua tibia para Engineer, materials o las molestias causadas por las hemorroides. Use crema antihemorroidal si el mdico se lo permite.  Descanse con las piernas elevadas si tiene calambres o dolor de cintura.  Si tiene venas varicosas en las piernas, use medias de descanso. Eleve los pies durante , 3 o 4veces por da. Limite la cantidad de sal en su dieta. Cuidados prenatales  Programe las visitas prenatales para la semana12 de [place]. Generalmente se programan cada mes al principio y se hacen ms frecuentes en los 2 ltimos meses antes del parto.  Escriba sus preguntas. Llvelas cuando concurra a las visitas prenatales.  Concurra a todas las visitas prenatales como se lo haya indicado el mdico. Seguridad  Colquese el cinturn de seguridad cuando conduzca.  Haga una lista de los nmeros de telfono de Associate Professor, que [employer] nmeros de telfono de [place], [place],  el hospital y los departamentos de polica y bomberos. Consejos generales  Pdale al mdico que la derive a clases de educacin prenatal en su localidad. Debe comenzar a tomar las clases antes de Cytogeneticist en el mes6 de embarazo.  Pida ayuda si tiene necesidades nutricionales o de asesoramiento Academic librarian. El mdico puede aconsejarla o derivarla a especialistas para que la ayuden con diferentes necesidades.  No se d baos de inmersin en agua caliente, baos turcos ni saunas.  No se haga duchas vaginales ni use tampones o toallas higinicas perfumadas.  No mantenga las piernas cruzadas durante [place].  Evite el contacto con las bandejas sanitarias de los gatos y la tierra que  estos animales usan. Estos elementos contienen bacterias que pueden causar defectos congnitos al beb y la posible prdida del feto debido a un aborto espontneo o muerte fetal.  No fume, no consuma hierbas ni medicamentos que no hayan sido recetados por el mdico. Las sustancias qumicas que estos productos contienen afectan la formacin y el desarrollo del beb.  No consuma ningn producto que contenga tabaco, lo que incluye cigarrillos, tabaco de Theatre manager y Administrator, Civil Service. Si necesita ayuda para dejar de fumar, consulte al [employer]. Puede recibir asesoramiento y otro tipo de recursos para dejar de fumar.  Programe una cita con el dentista. En su casa, lvese los dientes con un cepillo dental blando y psese el hilo dental con suavidad. SOLICITE ATENCIN MDICA SI:   Tiene mareos.  Siente clicos leves, presin en la pelvis o dolor persistente en el abdomen.  Tiene nuseas, vmitos o diarrea persistentes.  Tiene secrecin vaginal con mal olor.  Siente dolor al [employer].  Tiene el rostro, las [place], las piernas o los tobillos ms hinchados. SOLICITE ATENCIN MDICA DE INMEDIATO SI:   Tiene fiebre.  Tiene una prdida de lquido por la vagina.  Tiene sangrado o pequeas prdidas vaginales.  Siente dolor intenso o clicos en el abdomen.  Sube o baja de peso rpidamente.  Vomita sangre de color rojo brillante o material que parezca granos de caf.  Ha estado expuesta a la rubola y no ha sufrido la enfermedad.  Ha estado expuesta a la quinta enfermedad o a la varicela.  Tiene un dolor de cabeza intenso.  Le falta el aire.  Sufre cualquier tipo de traumatismo, por ejemplo, debido a una cada o un accidente automovilstico.   Esta informacin no tiene Theme park manager el consejo del mdico. Asegrese de hacerle al mdico cualquier pregunta que tenga.   Document Released: 05/23/2005 Document Revised: 09/03/2014 Elsevier Interactive Patient Education [employer].

## 2015-10-24 LAB — GC/CHLAMYDIA PROBE AMP (~~LOC~~) NOT AT ARMC
CHLAMYDIA, DNA PROBE: NEGATIVE
Neisseria Gonorrhea: NEGATIVE

## 2015-10-24 LAB — HIV ANTIBODY (ROUTINE TESTING W REFLEX): HIV Screen 4th Generation wRfx: NONREACTIVE

## 2015-11-19 ENCOUNTER — Encounter (HOSPITAL_COMMUNITY): Payer: Self-pay | Admitting: *Deleted

## 2015-11-19 ENCOUNTER — Inpatient Hospital Stay (HOSPITAL_COMMUNITY)
Admission: AD | Admit: 2015-11-19 | Discharge: 2015-11-19 | Disposition: A | Discharge status: Home / Self Care | Payer: Self-pay | Source: Ambulatory Visit | Attending: Obstetrics & Gynecology | Admitting: Obstetrics & Gynecology

## 2015-11-19 DIAGNOSIS — R197 Diarrhea, unspecified: Secondary | ICD-10-CM

## 2015-11-19 DIAGNOSIS — O219 Vomiting of pregnancy, unspecified: Secondary | ICD-10-CM

## 2015-11-19 DIAGNOSIS — Z3A11 11 weeks gestation of pregnancy: Secondary | ICD-10-CM | POA: Insufficient documentation

## 2015-11-19 DIAGNOSIS — Z880 Allergy status to penicillin: Secondary | ICD-10-CM | POA: Insufficient documentation

## 2015-11-19 DIAGNOSIS — O210 Mild hyperemesis gravidarum: Secondary | ICD-10-CM | POA: Insufficient documentation

## 2015-11-19 LAB — URINALYSIS, ROUTINE W REFLEX MICROSCOPIC
Bilirubin Urine: NEGATIVE
Glucose, UA: NEGATIVE mg/dL
Ketones, ur: NEGATIVE mg/dL
NITRITE: NEGATIVE
PROTEIN: NEGATIVE mg/dL
Specific Gravity, Urine: 1.025 (ref 1.005–1.030)
pH: 5.5 (ref 5.0–8.0)

## 2015-11-19 LAB — URINE MICROSCOPIC-ADD ON

## 2015-11-19 MED ORDER — PROMETHAZINE HCL 25 MG/ML IJ SOLN
25.0000 mg | Freq: Once | INTRAVENOUS | Status: AC
  Administered 2015-11-19: 25 mg via INTRAVENOUS
  Filled 2015-11-19: qty 1

## 2015-11-19 MED ORDER — PROMETHAZINE HCL 25 MG PO TABS: 25.0000 mg | ORAL_TABLET | Freq: Four times a day (QID) | ORAL | Status: DC | PRN

## 2015-11-19 NOTE — Discharge Instructions (Signed)
Drink at least 8 8-oz glasses of water every day. Soft or bland foods today.  Advance slowly as tolerated. Begin prenatal care as soon as possible.

## 2015-11-19 NOTE — MAU Note (Signed)
Pt states she has been having diarrhea, vomiting, and abdominal cramping for three days.

## 2015-11-19 NOTE — MAU Provider Note (Signed)
History     CSN: [id number]  Arrival date and time: 11/19/15 [id number]   First Provider Initiated Contact with Patient 11/19/15 [phone]      Chief Complaint  Patient presents with  . Emesis  . Diarrhea   HPI Amy [name] 35 y.o. [id number] Comes to MAU today with diarrhea for 24 hours and nausea and vomiting.  Has had vomiting once a day for several weeks in this preganancy but the vomiting in the past 24 hours has been worse and she cannot keep down food.  Has had soft stools for 2 days and then watery diarrhea since yesterday.   Has not yet started prenatal care.  Has abdominal pain and gurgling just prior to having watery diarrhea.  OB History    Gravida Para Term Preterm AB TAB SAB Ectopic Multiple Living   6 4 4  0 1  1  0 4      Past Medical History  Diagnosis Date  . Medical history non-contributory     Past Surgical History  Procedure Laterality Date  . Cesarean section      one previous  . Cervical cerclage N/A 01/13/2014    Procedure: CERCLAGE CERVICAL;  Surgeon: Adam PhenixJames G Arnold, MD;  Location: [facility];  Service: Gynecology;  Laterality: N/A;    Family History  Problem Relation Age of Onset  . Diabetes Father   . Diabetes Sister     Social History  Substance Use Topics  . Smoking status: Never Smoker   . Smokeless tobacco: Never Used  . Alcohol Use: No    Allergies:  Allergies  Allergen Reactions  . Penicillins Rash    Has patient had a PCN reaction causing immediate rash, facial/tongue/throat swelling, SOB or lightheadedness with hypotension: Yes Has patient had a PCN reaction causing severe rash involving mucus membranes or skin necrosis: Yes Has patient had a PCN reaction that required hospitalization No Has patient had a PCN reaction occurring within the last 10 years: No If all of the above answers are "NO", then may proceed with Cephalosporin use.     Prescriptions prior to admission  Medication Sig Dispense Refill Last Dose  . Prenatal  Vit-Fe Fumarate-FA (PRENATAL MULTIVITAMIN) TABS tablet Take 1 tablet by mouth daily at 12 noon.   Past Week at Unknown time  . fluticasone (CUTIVATE) 0.005 % ointment Apply 1 application topically 2 (two) times daily. (Patient not taking: Reported on 11/19/2015) 30 g 1 Past Week at Unknown time    Review of Systems  Constitutional: Negative for fever.  Gastrointestinal: Positive for nausea, vomiting, abdominal pain and diarrhea.  Genitourinary:       Small amount of vaginal discharge.  No itching or burning. No vaginal bleeding. No dysuria.    Physical Exam   Blood pressure 103/64, pulse 91, temperature 98.1 F (36.7 C), temperature source Oral, resp. rate 16, height 5' (1.524 m), weight 151 lb 9.6 oz (68.765 kg), last menstrual period 08/29/2015, currently breastfeeding.  Physical Exam  Nursing note and vitals reviewed. Constitutional: She is oriented to person, place, and time. She appears well-developed and well-nourished.  HENT:  Head: Normocephalic.  Eyes: EOM are normal.  Neck: Neck supple.  GI: Soft. There is no tenderness. There is no rebound and no guarding.  Musculoskeletal: Normal range of motion.  Neurological: She is alert and oriented to person, place, and time.  Skin: Skin is warm and dry.  Psychiatric: She has a normal mood and affect.    MAU Course  Procedures  Results for orders placed or performed during the hospital encounter of 11/19/15 (from the past 24 hour(s))  Urinalysis, Routine w reflex microscopic (not at [facility])     Status: Abnormal   Collection Time: 11/19/15  9:02 AM  Result Value Ref Range   Color, Urine YELLOW YELLOW   APPearance CLEAR CLEAR   Specific Gravity, Urine 1.025 1.005 - 1.030   pH 5.5 5.0 - 8.0   Glucose, UA NEGATIVE NEGATIVE mg/dL   Hgb urine dipstick TRACE (A) NEGATIVE   Bilirubin Urine NEGATIVE NEGATIVE   Ketones, ur NEGATIVE NEGATIVE mg/dL   Protein, ur NEGATIVE NEGATIVE mg/dL   Nitrite NEGATIVE NEGATIVE   Leukocytes, UA  TRACE (A) NEGATIVE  Urine microscopic-add on     Status: Abnormal   Collection Time: 11/19/15  9:02 AM  Result Value Ref Range   Squamous Epithelial / LPF 0-5 (A) NONE SEEN   WBC, UA 0-5 0 - 5 WBC/hpf   RBC / HPF 0-5 0 - 5 RBC/hpf   Bacteria, UA MANY (A) NONE SEEN   Urine-Other MUCOUS PRESENT     MDM Interpreter present for exam and interview.  Afebrile.  Will give IVF with Phenergan to control vomiting.  Discussed dietary measures needed for the next few days to avoid further problems with vomiting and diarrhea.  Client able to take ice chips and crackers without vomiting after IVF.    Assessment and Plan  Probably GI illness in addition to morning sickness.  Plan IVF with Phenergan 25 mg infused. BRAT diet.  Start slowly with all food intake today.  Advance slowly as tolerated. Begin prenatal care ASAP - to call for an appointment on Monday.  Has all paperwork together now.    BURLESON,TERRI 11/19/2015, 10:15 AM

## 2015-12-15 ENCOUNTER — Ambulatory Visit (HOSPITAL_COMMUNITY)
Admission: RE | Admit: 2015-12-15 | Discharge: 2015-12-15 | Disposition: A | Discharge status: Home / Self Care | Payer: Self-pay | Source: Ambulatory Visit | Attending: Obstetrics and Gynecology | Admitting: Obstetrics and Gynecology

## 2015-12-15 ENCOUNTER — Other Ambulatory Visit (HOSPITAL_COMMUNITY): Payer: Self-pay | Admitting: Nurse Practitioner

## 2015-12-15 ENCOUNTER — Encounter (HOSPITAL_COMMUNITY): Payer: Self-pay

## 2015-12-15 DIAGNOSIS — O09292 Supervision of pregnancy with other poor reproductive or obstetric history, second trimester: Secondary | ICD-10-CM | POA: Insufficient documentation

## 2015-12-15 DIAGNOSIS — O3432 Maternal care for cervical incompetence, second trimester: Secondary | ICD-10-CM

## 2015-12-15 DIAGNOSIS — O34219 Maternal care for unspecified type scar from previous cesarean delivery: Secondary | ICD-10-CM | POA: Insufficient documentation

## 2015-12-15 DIAGNOSIS — Z3A15 15 weeks gestation of pregnancy: Secondary | ICD-10-CM | POA: Insufficient documentation

## 2015-12-15 DIAGNOSIS — Z1389 Encounter for screening for other disorder: Secondary | ICD-10-CM

## 2015-12-15 DIAGNOSIS — Z9889 Other specified postprocedural states: Secondary | ICD-10-CM

## 2015-12-15 LAB — OB RESULTS CONSOLE HEPATITIS B SURFACE ANTIGEN: Hepatitis B Surface Ag: NEGATIVE

## 2015-12-15 LAB — OB RESULTS CONSOLE HIV ANTIBODY (ROUTINE TESTING): HIV: NONREACTIVE

## 2015-12-15 LAB — OB RESULTS CONSOLE GC/CHLAMYDIA
CHLAMYDIA, DNA PROBE: NEGATIVE
Gonorrhea: NEGATIVE

## 2015-12-15 LAB — OB RESULTS CONSOLE PLATELET COUNT: PLATELETS: 209 10*3/uL

## 2015-12-15 LAB — OB RESULTS CONSOLE VARICELLA ZOSTER ANTIBODY, IGG: VARICELLA IGG: IMMUNE

## 2015-12-15 LAB — OB RESULTS CONSOLE RPR: RPR: NONREACTIVE

## 2015-12-15 LAB — OB RESULTS CONSOLE RUBELLA ANTIBODY, IGM: Rubella: IMMUNE

## 2015-12-15 LAB — OB RESULTS CONSOLE ABO/RH: RH Type: POSITIVE

## 2015-12-15 LAB — OB RESULTS CONSOLE HGB/HCT, BLOOD
HEMATOCRIT: 38 %
Hemoglobin: 12.5 g/dL

## 2015-12-15 LAB — OB RESULTS CONSOLE ANTIBODY SCREEN: ANTIBODY SCREEN: NEGATIVE

## 2015-12-15 NOTE — ED Notes (Signed)
Pt reports mild cramping. 

## 2015-12-16 ENCOUNTER — Other Ambulatory Visit (HOSPITAL_COMMUNITY): Payer: Self-pay | Admitting: *Deleted

## 2015-12-16 DIAGNOSIS — O09299 Supervision of pregnancy with other poor reproductive or obstetric history, unspecified trimester: Secondary | ICD-10-CM

## 2015-12-16 DIAGNOSIS — O3430 Maternal care for cervical incompetence, unspecified trimester: Secondary | ICD-10-CM

## 2015-12-22 ENCOUNTER — Ambulatory Visit (HOSPITAL_COMMUNITY): Payer: Self-pay | Admitting: Anesthesiology

## 2015-12-22 ENCOUNTER — Ambulatory Visit (HOSPITAL_COMMUNITY)
Admission: RE | Admit: 2015-12-22 | Discharge: 2015-12-22 | Disposition: A | Discharge status: Home / Self Care | Payer: Self-pay | Source: Ambulatory Visit | Attending: Obstetrics & Gynecology | Admitting: Obstetrics & Gynecology

## 2015-12-22 ENCOUNTER — Ambulatory Visit (HOSPITAL_COMMUNITY): Payer: Self-pay

## 2015-12-22 ENCOUNTER — Encounter (HOSPITAL_COMMUNITY)
Admission: RE | Disposition: A | Discharge status: Home / Self Care | Payer: Self-pay | Source: Ambulatory Visit | Attending: Obstetrics & Gynecology

## 2015-12-22 DIAGNOSIS — O3442 Maternal care for other abnormalities of cervix, second trimester: Secondary | ICD-10-CM

## 2015-12-22 DIAGNOSIS — O34219 Maternal care for unspecified type scar from previous cesarean delivery: Secondary | ICD-10-CM

## 2015-12-22 DIAGNOSIS — Z880 Allergy status to penicillin: Secondary | ICD-10-CM | POA: Insufficient documentation

## 2015-12-22 DIAGNOSIS — O3432 Maternal care for cervical incompetence, second trimester: Secondary | ICD-10-CM | POA: Insufficient documentation

## 2015-12-22 DIAGNOSIS — Z79899 Other long term (current) drug therapy: Secondary | ICD-10-CM | POA: Insufficient documentation

## 2015-12-22 DIAGNOSIS — O3430 Maternal care for cervical incompetence, unspecified trimester: Secondary | ICD-10-CM

## 2015-12-22 HISTORY — PX: CERVICAL CERCLAGE: SHX1329

## 2015-12-22 LAB — CBC
HEMATOCRIT: 35.3 % — AB (ref 36.0–46.0)
HEMOGLOBIN: 12.2 g/dL (ref 12.0–15.0)
MCH: 29.3 pg (ref 26.0–34.0)
MCHC: 34.6 g/dL (ref 30.0–36.0)
MCV: 84.7 fL (ref 78.0–100.0)
Platelets: 215 10*3/uL (ref 150–400)
RBC: 4.17 MIL/uL (ref 3.87–5.11)
RDW: 13.9 % (ref 11.5–15.5)
WBC: 9.8 10*3/uL (ref 4.0–10.5)

## 2015-12-22 SURGERY — CERCLAGE, CERVIX, VAGINAL APPROACH
Anesthesia: Spinal | Site: Cervix

## 2015-12-22 MED ORDER — OXYCODONE-ACETAMINOPHEN 5-325 MG PO TABS: 1.0000 | ORAL_TABLET | Freq: Four times a day (QID) | ORAL | Status: DC | PRN

## 2015-12-22 MED ORDER — FENTANYL CITRATE (PF) 100 MCG/2ML IJ SOLN: 25.0000 ug | INTRAMUSCULAR | Status: DC | PRN

## 2015-12-22 MED ORDER — BUPIVACAINE HCL (PF) 0.5 % IJ SOLN
INTRAMUSCULAR | Status: DC | PRN
  Administered 2015-12-22: 20 mL

## 2015-12-22 MED ORDER — BUPIVACAINE HCL (PF) 0.5 % IJ SOLN
INTRAMUSCULAR | Status: AC
  Filled 2015-12-22: qty 30

## 2015-12-22 MED ORDER — LACTATED RINGERS IV SOLN: INTRAVENOUS | Status: DC

## 2015-12-22 MED ORDER — BUPIVACAINE IN DEXTROSE 0.75-8.25 % IT SOLN
INTRATHECAL | Status: DC | PRN
  Administered 2015-12-22: 1 mL via INTRATHECAL

## 2015-12-22 MED ORDER — OXYCODONE HCL 5 MG PO TABS
ORAL_TABLET | ORAL | Status: DC
Start: 2015-12-22 — End: 2015-12-22
  Filled 2015-12-22: qty 1

## 2015-12-22 MED ORDER — LACTATED RINGERS IV SOLN
INTRAVENOUS | Status: DC
  Administered 2015-12-22 (×2): via INTRAVENOUS

## 2015-12-22 MED ORDER — INDOMETHACIN 50 MG RE SUPP
100.0000 mg | Freq: Once | RECTAL | Status: DC
  Filled 2015-12-22: qty 2

## 2015-12-22 MED ORDER — PHENYLEPHRINE 40 MCG/ML (10ML) SYRINGE FOR IV PUSH (FOR BLOOD PRESSURE SUPPORT)
PREFILLED_SYRINGE | INTRAVENOUS | Status: AC
  Filled 2015-12-22: qty 10

## 2015-12-22 MED ORDER — INDOMETHACIN 50 MG RE SUPP
RECTAL | Status: DC | PRN
  Administered 2015-12-22: 100 mg via RECTAL

## 2015-12-22 MED ORDER — OXYCODONE HCL 5 MG PO TABS
5.0000 mg | ORAL_TABLET | Freq: Once | ORAL | Status: AC
  Administered 2015-12-22: 5 mg via ORAL

## 2015-12-22 SURGICAL SUPPLY — 21 items
CLOTH BEACON ORANGE TIMEOUT ST (SAFETY) ×3 IMPLANT
COUNTER NEEDLE 1200 MAGNETIC (NEEDLE) ×2 IMPLANT
GLOVE BIO SURGEON STRL SZ7 (GLOVE) ×3 IMPLANT
GLOVE BIOGEL PI IND STRL 7.0 (GLOVE) ×2 IMPLANT
GLOVE BIOGEL PI INDICATOR 7.0 (GLOVE) ×4
GLOVE SURG SS PI 7.0 STRL IVOR (GLOVE) ×6 IMPLANT
GOWN STRL REUS W/TWL LRG LVL3 (GOWN DISPOSABLE) ×6 IMPLANT
GOWN STRL REUS W/TWL XL LVL3 (GOWN DISPOSABLE) ×5 IMPLANT
NDL MAYO CATGUT SZ4 TPR NDL (NEEDLE) ×1 IMPLANT
NEEDLE MAYO CATGUT SZ4 (NEEDLE) ×3 IMPLANT
PACK VAGINAL MINOR WOMEN LF (CUSTOM PROCEDURE TRAY) ×3 IMPLANT
PAD OB MATERNITY 4.3X12.25 (PERSONAL CARE ITEMS) ×3 IMPLANT
PAD PREP 24X48 CUFFED NSTRL (MISCELLANEOUS) ×3 IMPLANT
SUT ETHIBOND  5 (SUTURE) ×2
SUT ETHIBOND 5 (SUTURE) ×1 IMPLANT
TOWEL OR 17X24 6PK STRL BLUE (TOWEL DISPOSABLE) ×6 IMPLANT
TRAY FOLEY CATH SILVER 14FR (SET/KITS/TRAYS/PACK) ×3 IMPLANT
TUBING NON-CON 1/4 X 20 CONN (TUBING) ×1 IMPLANT
TUBING NON-CON 1/4 X 20' CONN (TUBING) ×1
WATER STERILE IRR 1000ML POUR (IV SOLUTION) ×3 IMPLANT
YANKAUER SUCT BULB TIP NO VENT (SUCTIONS) ×2 IMPLANT

## 2015-12-22 NOTE — Anesthesia Procedure Notes (Signed)
Spinal Patient location during procedure: OR Start time: 12/22/2015 9:14 AM End time: 12/22/2015 9:16 AM Staffing Anesthesiologist: Leilani AbleHATCHETT, Etta Gassett Performed by: anesthesiologist  Preanesthetic Checklist Completed: patient identified, surgical consent, pre-op evaluation, timeout performed, IV checked, risks and benefits discussed and monitors and equipment checked Spinal Block Patient position: sitting Prep: site prepped and draped and DuraPrep Patient monitoring: heart rate, cardiac monitor, continuous pulse ox and blood pressure Approach: midline Location: L3-4 Injection technique: single-shot Needle Needle type: Sprotte  Needle gauge: 24 G Needle length: 9 cm Needle insertion depth: 7 cm Assessment Sensory level: T12

## 2015-12-22 NOTE — Transfer of Care (Signed)
Immediate Anesthesia Transfer of Care Note  Patient: Amy [name]  Procedure(s) Performed: Procedure(s): CERCLAGE CERVICAL (N/A)  Patient Location: PACU  Anesthesia Type:Spinal  Level of Consciousness: awake, alert  and oriented  Airway & Oxygen Therapy: Patient Spontanous Breathing  Post-op Assessment: Report given to RN and Post -op Vital signs reviewed and stable  Post vital signs: Reviewed and stable  Last Vitals:  Filed Vitals:   12/22/15 1000 12/22/15 1015  BP: 102/64 105/69  Pulse: 69 66  Temp:    Resp: 17 16    Last Pain:  Filed Vitals:   12/22/15 1023  PainSc: Asleep      Patients Stated Pain Goal: 3 (12/22/15 1015)  Complications: No apparent anesthesia complications

## 2015-12-22 NOTE — H&P (Signed)
Preoperative History and Physical  Amy [name] is a 35 y.o. [id number] here for surgical management of cervical insufficiency.   Proposed surgery: McDonald cerclage  Past Medical History  Diagnosis Date  . Medical history non-contributory    Past Surgical History  Procedure Laterality Date  . Cesarean section      one previous  . Cervical cerclage N/A 01/13/2014    Procedure: CERCLAGE CERVICAL;  Surgeon: Adam Phenix, MD;  Location: [facility];  Service: Gynecology;  Laterality: N/A;   OB History    Gravida Para Term Preterm AB TAB SAB Ectopic Multiple Living   0 1  1  0 4     Patient denies any cervical dysplasia or STIs. Prescriptions prior to admission  Medication Sig Dispense Refill Last Dose  . acetaminophen (TYLENOL) 325 MG tablet Take 325 mg by mouth every 6 (six) hours as needed for mild pain.     . Prenatal Vit-Fe Fumarate-FA (PRENATAL MULTIVITAMIN) TABS tablet Take 1 tablet by mouth daily at 12 noon.   Taking  . promethazine (PHENERGAN) 25 MG tablet Take 1 tablet (25 mg total) by mouth every 6 (six) hours as needed for nausea or vomiting. 30 tablet 0 Taking    Allergies  Allergen Reactions  . Penicillins Rash    Has patient had a PCN reaction causing immediate rash, facial/tongue/throat swelling, SOB or lightheadedness with hypotension: Yes Has patient had a PCN reaction causing severe rash involving mucus membranes or skin necrosis: Yes Has patient had a PCN reaction that required hospitalization No Has patient had a PCN reaction occurring within the last 10 years: No If all of the above answers are "NO", then may proceed with Cephalosporin use.    Social History:   reports that she has never smoked. She has never used smokeless tobacco. She reports that she does not drink alcohol or use illicit drugs. Family History  Problem Relation Age of Onset  . Diabetes Father   . Diabetes Sister     Review of Systems: Noncontributory  PHYSICAL  EXAM: Blood pressure 122/69, pulse 74, temperature 98.1 F (36.7 C), temperature source Oral, resp. rate 20, height  (1.549 m), weight 159 lb (72.122 kg), last menstrual period 08/29/2015, currently breastfeeding. General appearance - alert, well appearing, and in no distress Chest - clear to auscultation, no wheezes, rales or rhonchi, symmetric air entry Heart - normal rate and regular rhythm Abdomen - soft, nontender, nondistended, no masses or organomegaly Pelvic - examination not indicated Extremities - peripheral pulses normal, no pedal edema, no clubbing or cyanosis  Labs: Results for orders placed or performed during the hospital encounter of 12/22/15 (from the past 336 hour(s))  CBC   Collection Time: 12/22/15  8:00 AM  Result Value Ref Range   WBC 9.8 4.0 - 10.5 K/uL   RBC 4.17 3.87 - 5.11 MIL/uL   Hemoglobin 12.2 12.0 - 15.0 g/dL   HCT 14.7 (L) 82.9 - 56.2 %   MCV 84.7 78.0 - 100.0 fL   MCH 29.3 26.0 - 34.0 pg   MCHC 34.6 30.0 - 36.0 g/dL   RDW 13.0 86.5 - 78.4 %   Platelets 215 150 - 400 K/uL    Imaging Studies: [place] Mfm Ob Transvaginal  12-31-2015  OBSTETRICAL ULTRASOUND: This exam was performed within a [facility]. The OB US report was generated in the [facility] system, and faxed to the ordering physician.  This report is available in the [employer]. See  the [facility] Obstetric US report via the Image Link.   Assessment: Patient Active Problem List   Diagnosis Date Noted  . Laceration, obstetrical, third degree 07/10/2014  . Active labor 07/09/2014  . 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 Tinea versicolor 12/30/2013  . Bacterial vaginosis 12/30/2013  . Supervision of normal pregnancy 12/28/2013  . Previous cesarean delivery, antepartum condition or complication 12/28/2013  . Cervical incompetence affecting management of pregnancy, antepartum 12/28/2013    Plan: Patient will undergo surgical management with McDonald  cerclage.   The risks of surgery were discussed in detail with the patient including but not limited to: bleeding which may require transfusion or reoperation; infection which may require antibiotics; injury to surrounding organs which may involve bowel, bladder, ureters ; need for additional procedures including laparoscopy or laparotomy; thromboembolic phenomenon, surgical site problems and other postoperative/anesthesia complications. Likelihood of success in alleviating the patient's condition was discussed. Routine postoperative instructions will be reviewed with the patient and her family in detail after surgery.  The patient concurred with the proposed plan, giving informed written consent for the surgery.  Patient has been NPO since last night she will remain NPO for procedure.  Anesthesia and OR aware.  Preoperative prophylactic antibiotics and SCDs ordered on call to the OR.  To OR when ready.  Jamespaul Secrist L. Harraway-Smith, M.D., [facility] Center[facility]ACOG 12/22/2015 9:01 AM

## 2015-12-22 NOTE — Anesthesia Postprocedure Evaluation (Signed)
Anesthesia Post Note  Patient: Amy [name]  Procedure(s) Performed: Procedure(s) (LRB): CERCLAGE CERVICAL (N/A)  Patient location during evaluation: PACU Anesthesia Type: Spinal Level of consciousness: oriented and awake and alert Pain management: pain level controlled Vital Signs Assessment: post-procedure vital signs reviewed and stable Respiratory status: spontaneous breathing, respiratory function stable and patient connected to nasal cannula oxygen Cardiovascular status: blood pressure returned to baseline and stable Postop Assessment: no headache and no backache Anesthetic complications: no     Last Vitals:  Filed Vitals:   12/22/15 1430 12/22/15 1435  BP: 97/58 95/56  Pulse: 82 94  Temp:  37.2 C  Resp: 22 20    Last Pain:  Filed Vitals:   12/22/15 1441  PainSc: 0-No pain   Pain Goal: Patients Stated Pain Goal: 3 (12/22/15 1430)               Shelton SilvasKevin D Shawntrice Salle

## 2015-12-22 NOTE — Op Note (Signed)
12/22/2015  10:00 AM  PATIENT:  Amy [name]  35 y.o. female  PRE-OPERATIVE DIAGNOSIS:   Incompetent cervix  POST-OPERATIVE DIAGNOSIS:   Incompetent cervix  PROCEDURE:  Procedure(s): CERCLAGE CERVICAL (N/A)  SURGEON:  Surgeon(s) and Role:    * Willodean Rosenthalarolyn Harraway-Smith, MD - Primary  ANESTHESIA:   spinal; paracervical block  EBL:  Total I/O In: 1000 [I.V.:1000] Out: 110 [Urine:100; Blood:10]  BLOOD ADMINISTERED:none  DRAINS: none   LOCAL MEDICATIONS USED:  MARCAINE     SPECIMEN:  No Specimen  DISPOSITION OF SPECIMEN:  N/A  COUNTS:  YES  TOURNIQUET:  * No tourniquets in log *  DICTATION: .Note written in EPIC  PLAN OF CARE: Discharge to home after PACU  PATIENT DISPOSITION:  PACU - hemodynamically stable.   Delay start of Pharmacological VTE agent (>24hrs) due to surgical blood loss or risk of bleeding: yes  Complications: none immediate  35 y.o. [id number] at 6024w3d with history of cervical incompetence, here for cerclage placement.   The risks of surgery were discussed in detail with the patient including but not limited to: bleeding; infection which may require antibiotic therapy; injury to cervix, vagina other surrounding organs; risk of ruptured membranes and/or preterm delivery and other postoperative or anesthesia complications.  Written informed consent was obtained.  Eda Royal the Spanish interpreter was used for the entire discussion.  FINDINGS:  About 3 cm palpable cervical length in the vagina, external os patent.  Internal os was visually 1 cm. Suture knot placed anteriorly.  PROCEDURE IN DETAIL:  The patient received intravenous antibiotics and had sequential compression devices applied to her lower extremities while in the preoperative area.  Reassuring fetal heart rate was also obtained using a doppler. She was then taken to the operating room where spinal anesthesia was administered and was found to be adequate.  She was placed in the dorsal  lithotomy, and was prepped and draped in a sterile manner. Her bladder was catheterized for an unmeasured amount of clear, yellow urine. After an adequate timeout was performed, a vaginal speculum was then placed in the patient's vagina and a single tooth tenaculum was applied to the anterior lip of the cervix.  A paracervical block of 10cc of .5% Marcaine was placed at the 5 and 7 o clock positions to creat a paracervical block.  The anterior and posterior lips of the cervix was grasped with ring forceps. A curved needle loaded with a number 5 Ethibond suture was inserted at 12 o'clock, as high as possible at the junction of the rugated vaginal epithelium and the smooth cervix, at least 2 cm above the external os.  Four bites were taken circumferentially around the entire cervix in a purse-string fashion, each bite should be deep enough to extend at least midway into the cervical stroma, but not into the endocervical canal.  An attempt was made to avoid the previous cerclage placement sites. The two ends of the suture were then tied securely anteriorly and cut, leaving the ends long enough to grasp with a clamp when it is time to remove it. There was minimal bleeding noted and the ring forceps were removed with good hemostasis noted.  All instruments were removed from the patient's vagina.  Instrument, needle and sponge counts were correct x 2.  Indomethacin 100 mg rectal suppository was placed.  The patient tolerated the procedure well, and was taken to the recovery area awake and in stable condition. Reassuring fetal heart rate was also obtained using a doppler in the  recovery area.  The patient will be discharged to home as per PACU criteria.  Routine postoperative instructions given.  She was prescribed Percocet and Colace.  She will follow up in the clinic next week for her initial OB visit (she was a transfer from the [facility]).  Jameca Chumley L. Harraway-Smith, M.D., [facility]

## 2015-12-22 NOTE — Brief Op Note (Signed)
12/22/2015  10:00 AM  PATIENT:  Clydette Enriquez-Madariaga  35 y.o. female  PRE-OPERATIVE DIAGNOSIS:   Incompetent cervix  POST-OPERATIVE DIAGNOSIS:   Incompetent cervix  PROCEDURE:  Procedure(s): CERCLAGE CERVICAL (N/A)  SURGEON:  Surgeon(s) and Role:    * Willodean Rosenthalarolyn Harraway-Smith, MD - Primary  ANESTHESIA:   spinal; paracervical block  EBL:  Total I/O In: 1000 [I.V.:1000] Out: 110 [Urine:100; Blood:10]  BLOOD ADMINISTERED:none  DRAINS: none   LOCAL MEDICATIONS USED:  MARCAINE     SPECIMEN:  No Specimen  DISPOSITION OF SPECIMEN:  N/A  COUNTS:  YES  TOURNIQUET:  * No tourniquets in log *  DICTATION: .Note written in EPIC  PLAN OF CARE: Discharge to home after PACU  PATIENT DISPOSITION:  PACU - hemodynamically stable.   Delay start of Pharmacological VTE agent (>24hrs) due to surgical blood loss or risk of bleeding: yes  Complications: none immediate  Rito Lecomte L. Harraway-Smith, M.D., [facility]ACOG

## 2015-12-22 NOTE — Discharge Instructions (Signed)
Cerclaje cervical, cuidados posteriores (Cervical Cerclage, Care After) Siga estas instrucciones durante las prximas semanas. Estas indicaciones le proporcionan informacin acerca de cmo deber cuidarse despus del procedimiento. El mdico tambin podr darle instrucciones ms especficas. El tratamiento se ha planificado de acuerdo a las prcticas mdicas actuales, pero a veces se producen problemas. Comunquese con el mdico si tiene algn problema o tiene dudas despus del procedimiento. QU ESPERAR DESPUS DEL PROCEDIMIENTO  Despus del procedimiento, es tpico tener los siguientes sntomas:  Clicos abdominales.  Prdida o hemorragia vaginal. INSTRUCCIONES PARA EL CUIDADO EN EL HOGAR   Utilice los medicamentos de venta libre o recetados para Primary school teachercalmar el dolor, Environmental health practitionerel malestar o la fiebre, segn se lo indique el mdico.  Evite las actividades fsicas y los ejercicios hasta que su mdico la autorice.  No se haga duchas vaginales ni tenga relaciones sexuales hasta que el mdico la autorice.  Cumpla con todas las visitas de control de Azerbaijanciruga y Clinical research associateprenatales, segn le indique su mdico. SOLICITE ATENCIN MDICA SI:   Tiene flujo vaginal anormal.  Tiene una erupcin cutnea.  Se siente mareada o se desmaya.  Tiene dolor abdominal y no puede controlarlo con analgsicos. SOLICITE ATENCIN MDICA DE INMEDIATO SI:   Presenta hemorragia vaginal.  Tiene una prdida de lquido importante o sale lquido a chorros por la vagina.  Tiene fiebre.  Se desmaya.  Siente contracciones en el tero.  Siente que el beb no se mueve tanto como antes o no puede sentir los movimientos del beb.  Siente falta de aire o Journalist, newspaperdolor en el pecho.   Esta informacin no tiene Theme park managercomo fin reemplazar el consejo del mdico. Asegrese de hacerle al mdico cualquier pregunta que tenga.   Document Released: 06/03/2013 Document Revised: 08/18/2013 Elsevier Interactive Patient Education [employer]2016 Elsevier Inc.

## 2015-12-22 NOTE — Anesthesia Preprocedure Evaluation (Signed)
Anesthesia Evaluation  Patient identified by MRN, date of birth, ID band Patient awake    Reviewed: Allergy & Precautions, H&P , NPO status , Patient's Chart, lab work & pertinent test results  Airway Mallampati: II  TM Distance: >3 FB Neck ROM: full    Dental  (+) Poor Dentition   Pulmonary neg pulmonary ROS,    Pulmonary exam normal breath sounds clear to auscultation       Cardiovascular negative cardio ROS Normal cardiovascular exam     Neuro/Psych negative neurological ROS  negative psych ROS   GI/Hepatic negative GI ROS, Neg liver ROS,   Endo/Other  negative endocrine ROS  Renal/GU negative Renal ROS  negative genitourinary   Musculoskeletal negative musculoskeletal ROS (+)   Abdominal Normal abdominal exam  (+)   Peds  Hematology negative hematology ROS (+)   Anesthesia Other Findings   Reproductive/Obstetrics (+) Pregnancy                             Anesthesia Physical Anesthesia Plan  ASA: II  Anesthesia Plan: Spinal   Post-op Pain Management:    Induction:   Airway Management Planned:   Additional Equipment:   Intra-op Plan:   Post-operative Plan:   Informed Consent: I have reviewed the patients History and Physical, chart, labs and discussed the procedure including the risks, benefits and alternatives for the proposed anesthesia with the patient or authorized representative who has indicated his/her understanding and acceptance.     Plan Discussed with: CRNA, Anesthesiologist and Surgeon  Anesthesia Plan Comments: (Previous SAB for cervical cerclage w/o complication)        Anesthesia Quick Evaluation

## 2015-12-23 ENCOUNTER — Encounter: Payer: Self-pay | Admitting: *Deleted

## 2015-12-23 DIAGNOSIS — O34219 Maternal care for unspecified type scar from previous cesarean delivery: Secondary | ICD-10-CM

## 2015-12-23 DIAGNOSIS — O3430 Maternal care for cervical incompetence, unspecified trimester: Secondary | ICD-10-CM

## 2015-12-23 DIAGNOSIS — O0990 Supervision of high risk pregnancy, unspecified, unspecified trimester: Secondary | ICD-10-CM

## 2015-12-26 ENCOUNTER — Ambulatory Visit (INDEPENDENT_AMBULATORY_CARE_PROVIDER_SITE_OTHER): Payer: Self-pay | Admitting: Advanced Practice Midwife

## 2015-12-26 ENCOUNTER — Encounter: Payer: Self-pay | Admitting: Advanced Practice Midwife

## 2015-12-26 VITALS — BP 98/76 | HR 76 | Temp 98.4°F | Wt 154.8 lb

## 2015-12-26 DIAGNOSIS — O352XX0 Maternal care for (suspected) hereditary disease in fetus, not applicable or unspecified: Secondary | ICD-10-CM

## 2015-12-26 DIAGNOSIS — Z113 Encounter for screening for infections with a predominantly sexual mode of transmission: Secondary | ICD-10-CM

## 2015-12-26 DIAGNOSIS — O34219 Maternal care for unspecified type scar from previous cesarean delivery: Secondary | ICD-10-CM

## 2015-12-26 DIAGNOSIS — O0990 Supervision of high risk pregnancy, unspecified, unspecified trimester: Secondary | ICD-10-CM

## 2015-12-26 LAB — POCT URINALYSIS DIP (DEVICE)
Bilirubin Urine: NEGATIVE
GLUCOSE, UA: NEGATIVE mg/dL
HGB URINE DIPSTICK: NEGATIVE
Ketones, ur: NEGATIVE mg/dL
LEUKOCYTES UA: NEGATIVE
NITRITE: NEGATIVE
PH: 6 (ref 5.0–8.0)
Protein, ur: NEGATIVE mg/dL
SPECIFIC GRAVITY, URINE: 1.025 (ref 1.005–1.030)
UROBILINOGEN UA: 0.2 mg/dL (ref 0.0–1.0)

## 2015-12-26 NOTE — Patient Instructions (Signed)

## 2015-12-26 NOTE — Progress Notes (Signed)
Breastfeeding tip of the week reviewed. 

## 2015-12-27 LAB — WET PREP, GENITAL
TRICH WET PREP: NONE SEEN
YEAST WET PREP: NONE SEEN

## 2015-12-27 LAB — GC/CHLAMYDIA PROBE AMP (~~LOC~~) NOT AT ARMC
Chlamydia: NEGATIVE
Neisseria Gonorrhea: NEGATIVE

## 2015-12-28 ENCOUNTER — Encounter: Payer: Self-pay | Admitting: *Deleted

## 2015-12-29 ENCOUNTER — Encounter (HOSPITAL_COMMUNITY): Payer: Self-pay

## 2015-12-29 ENCOUNTER — Ambulatory Visit (HOSPITAL_COMMUNITY)
Admit: 2015-12-29 | Discharge: 2015-12-29 | Disposition: A | Discharge status: Home / Self Care | Payer: Self-pay | Attending: Obstetrics and Gynecology | Admitting: Obstetrics and Gynecology

## 2015-12-29 ENCOUNTER — Ambulatory Visit (HOSPITAL_COMMUNITY)
Admission: RE | Admit: 2015-12-29 | Discharge: 2015-12-29 | Disposition: A | Discharge status: Home / Self Care | Payer: Self-pay | Source: Ambulatory Visit | Attending: Obstetrics and Gynecology | Admitting: Obstetrics and Gynecology

## 2015-12-29 VITALS — BP 117/61 | HR 82 | Wt 159.1 lb

## 2015-12-29 DIAGNOSIS — O34219 Maternal care for unspecified type scar from previous cesarean delivery: Secondary | ICD-10-CM | POA: Insufficient documentation

## 2015-12-29 DIAGNOSIS — O3430 Maternal care for cervical incompetence, unspecified trimester: Secondary | ICD-10-CM

## 2015-12-29 DIAGNOSIS — O3432 Maternal care for cervical incompetence, second trimester: Secondary | ICD-10-CM | POA: Insufficient documentation

## 2015-12-29 DIAGNOSIS — Z315 Encounter for genetic counseling: Secondary | ICD-10-CM | POA: Insufficient documentation

## 2015-12-29 DIAGNOSIS — O09292 Supervision of pregnancy with other poor reproductive or obstetric history, second trimester: Secondary | ICD-10-CM | POA: Insufficient documentation

## 2015-12-29 DIAGNOSIS — O0990 Supervision of high risk pregnancy, unspecified, unspecified trimester: Secondary | ICD-10-CM

## 2015-12-29 DIAGNOSIS — O3442 Maternal care for other abnormalities of cervix, second trimester: Secondary | ICD-10-CM | POA: Insufficient documentation

## 2015-12-29 DIAGNOSIS — O352XX0 Maternal care for (suspected) hereditary disease in fetus, not applicable or unspecified: Secondary | ICD-10-CM | POA: Insufficient documentation

## 2015-12-29 NOTE — Progress Notes (Signed)
Subjective:  Amy [name] is a 35 y.o. [id number] at 5366w0d by LMP/7 weeks US [facility] She is transferring care due to Hx incomepetent cervix. Hx FD due to previable delivery at 22 weeks, Hx child w/ small NTD. Had prophylactic cerclage placed 12/22/15. Normal CL. No cramping. MFM did not recommend Prometrium or 17-P. Hx C/S x 1 for arrest of descent. Successful VBAC x 4. Hx macrosomia x 2 w/out birth complications.   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  Patient reports yellow vaginal discharge w/ small streaks of blood since placement of cerclage. Denies abd pain, LOF.  Contractions: Irritability. Vag. Bleeding: None.  Movement: Present. Denies leaking of fluid.   The following portions of the patient's history were reviewed and updated as appropriate: allergies, current medications, past family history, past medical history, past social history, past surgical history and problem list. Problem list updated.  Health Dept record reviewed.   Objective:   Filed Vitals:   12/26/15 1009  BP: 98/76  Pulse: 76  Temp: 98.4 F (36.9 C)  Weight: 154 lb 12.8 oz (70.217 kg)    Fetal Status: Fetal Heart Rate (bpm): 130   Movement: Present     General:  Alert, oriented and cooperative. Patient is in no acute distress.  Skin: Skin is warm and dry. No rash noted.   Cardiovascular: Normal heart rate noted  Respiratory: Normal respiratory effort, no problems with respiration noted  Abdomen: Soft, gravid, appropriate for gestational age. Pain/Pressure: Present     Pelvic: Vag. Bleeding: None Vag D/C Character: Yellow   Cervical exam  performed   Visually closed and long. Scant slightly yellow, odorless discharge. Cerclage visualized. Trace steak of blood.    Extremities: Normal range of motion.  Edema: Trace  Mental Status: Normal mood and affect. Normal behavior. Normal judgment and thought content.   Urinalysis:    N/N  Assessment and Plan:  Pregnancy: [id number] at 8275w3d  1. Supervision of high risk pregnancy, antepartum, unspecified trimester  - GC[facility]lam[facility] - Wet prep, genital  2. 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   3. Previous cesarean delivery, antepartum condition or complication.   - Planning TOLAC  4. Hx cervical incompetence.  - CL Q2 weeks  Preterm labor symptoms and general obstetric precautions including but not limited to vaginal bleeding, contractions, leaking of fluid and fetal movement were reviewed in detail with the patient. Please refer to After Visit Summary for other counseling recommendations.  Verified w/ Dr. Jolayne Pantheronstant that pt does not need 17-P or Vaginal progesterone.  Return in about 4 weeks (around 01/23/2016).   Dorathy KinsmanVirginia Rishabh Rinkenberger, CNM

## 2015-12-30 ENCOUNTER — Encounter: Payer: Self-pay | Admitting: *Deleted

## 2015-12-30 NOTE — Progress Notes (Addendum)
Genetic Counseling  High-Risk Gestation Note  Appointment Date:  12/29/2015 Referred By: Mora Bellman, MD Date of Birth:  [birth year]   Pregnancy History: [id number] Estimated Date of Delivery: 06/04/16 Estimated Gestational Age: 31w3dAttending: MRenella Cunas MD   I met with Ms. CStarkisha Tullisfor genetic counseling because of a family history of neural tube defects. [employer] Spanish/English medical interpreter, [place] was present for today's visit.   In summary:  Discussed family history of NTD; patient's son from previous partner born with encephalocele  Patient reported one nephew with open spina bifida  Reviewed spectrum of NTDs and that m68of NTDs are sporadic and multifactorial inheritance is suspected; Discussed other causes include chromosome, single gene, environmental  Recurrence risk for NTD second degree relative is approximately 1-2%  Discussed options of screening / testing for NTD including detailed ultrasound, scheduled for 01/12/16  Patient had Quad screen within normal range for ONTDs, Down syndrome, and Trisomy 18  Reviewed limitations of Quad screen, particularly for detection of skin covered NTDs  Patient declined amniocentesis  Discussed general population carrier screening options- Previously had hemoglobinopathy and cystic fibrosis carrier screening, which are within normal range  We began by reviewing the family history in detail. Ms. EDobranskyreported that her son, CCostella Hatcher with a previous partner was born with an opening at the top of his skull, described to be the shape of a little bowl and soft to the touch. [facility] several [facility] She was told that he would have an increased chance for delays, and he received therapies for one year of life. He is currently 35years old and reportedly healthy with no reported intellectual or developmental delays. Ms. EJagielski gave uKoreapermission to [facility] that he had surgical repair of encephalocele.   We discussed that an encephalocele is characterized by an opening in the fetal skull, which can allow herniation of fluid, the meninges, and brain into the amniotic fluid.  The fluid and tissue is typically covered by a membrane.  We discussed that this finding fits within the neural tube defect spectrum.  We discussed that encephaloceles are most commonly considered to be multifactorial in etiology, due to a combination of both environmental and genetic factors.  We reviewed known teratogens associated with an increased risk for NTDs including folic acid antagonists, maternal diabetes, hyperthermia, and folic acid deficiency.  We discussed the importance of folic acid in reducing the risk of recurrence and that a dose of 4 mg is recommended for women with a history of a child with an ONTD, including encephalocele.   Additionally, we discussed that an encephalocele is a major feature of multiple single gene conditions.  We also reviewed that encephaloceles can be due to an underlying chromosome or single gene condition.  Approximately 5-10% of encephaloceles are associated with a chromosome abnormality, specifically trisomy 124 18, and mosaic trisomy 20.  Ms. CRonalee Scheunemannwas counseled that in the case of multifactorial inheritance, the risk of recurrence for ONTDs for full siblings is ~3-4%. Given that the current pregnancy is with a different father, the recurrence risk would be estimated to be 1-2%.   Ms. EHolquinalso reported a nephew (a son of one of the patient's 5 brothers) born with open spina bifida. [facility] He is currently 35years [facility]. He was not described to have additional medical concerns.  We reviewed that open spina bifida fits in the spectrum and is the more common form of ONTDs. We  reviewed the various differential causes, including multifactorial for many.   We reviewed screening options for ONTDs in pregnancy. Ms. Simon had Quad screening through [place], which was within normal range for all conditions, including ONTDs. We reviewed the detection rates from this screen and that this may be reduced for skin covered NTDs. Detailed ultrasound is also available to assess for ONTDs. Ms. Bryand has detailed ultrasound scheduled for 01/12/16. We discussed the option of amniocentesis and the detection rate for ONTDs. We reviewed the risks, benefits, and limitations, including the associated 1 in 628-315 risk for complications, including spontaneous pregnancy loss. Ms. Gowen declined amniocentesis.   The family histories were otherwise found to be contributory for a niece to the patient (her sister's daughter) who died at age 39 years following surgery for a liver tumor. The patient had very limited information regarding the specific type of tumor or if an etiology was determined. The patient also reported that five of her siblings (out of a sibship of 71) died in infancy or childhood. She had very limited information about the cause of death, aside from one that was due to an accident. We discussed that without additional information regarding these individuals, it is unclear if this history would have potential implications for relatives.   Ms. Boehning also reported a maternal first cousin with Down syndrome. She has limited contact with this relative, given that they live in [place], but reported that their features seem consistent with what she has read and learned about Down syndrome. We discussed that 95% of cases of Down syndrome are not inherited and are the result of non-disjunction.  Three to 4% of cases of Down syndrome are the result of a translocation involving chromosome #21.  We discussed the option of  chromosome analysis to determine if an individual is a carrier of a balanced translocation involving chromosome #21.  If an individual carries a balanced translocation involving chromosome #21, then the chance to have a baby with Down syndrome would be greater than the maternal age-related risk.  This family history is most likely consistent with sporadic occurrence of Down syndrome. We reviewed the patient's age related risk for fetal aneuploidy and that Quad screen reduced the risk for fetal Down syndrome to 1 in 870.   The father of the pregnancy reportedly has a sister with intellectual disability. She is in her late 26s-early 30s and lives with her mother in [place]. She was described to have no speech and to have behaviors that are more "child-like." She was described to have a skin condition but no additional medical concerns and no described dysmorphic features. She reportedly has not been evaluated by specialists, and an underlying cause is not known. Ms. Borgmeyer was counseled that there are many different causes of intellectual disabilities including environmental, multifactorial, and genetic etiologies.  We discussed that a specific diagnosis for intellectual disability can be determined in approximately 50% of these individuals.  In the remaining 50% of individuals, a diagnosis may never be determined.  Regarding genetic causes, we discussed that chromosome aberrations (aneuploidy, deletions, duplications, insertions, and translocations) are responsible for a small percentage of individuals with intellectual disability.  Many individuals with chromosome aberrations have additional differences, including congenital anomalies or minor dysmorphisms.  Likewise, single gene conditions are the underlying cause of intellectual delay in some families.  We discussed that many  gene conditions have intellectual disability as a feature, but also often include other physical or medical differences.  We  discussed the option of this family having an evaluation by a medical geneticist to help determine the cause of the familial intellectual disability, but Ms. Sorber indicated that the mother does not seem to be interested in pursuing evaluations at this time.  We discussed that without more specific information, it is difficult to provide an accurate risk assessment and additional screening or testing in the current pregnancy would likely have very limited utility regarding this family history.  Further genetic counseling is warranted if more information is obtained.  Ms. Scheck was provided with written information regarding sickle cell anemia (SCA) including the carrier frequency and incidence in the Hispanic population, the availability of carrier testing and prenatal diagnosis if indicated.  In addition, we discussed that hemoglobinopathies are routinely screened for as part of the [place] newborn screening panel.  She previously had hemoglobin electrophoresis through her OB office, which was within normal range. She also had negative cystic fibrosis carrier screening through her OB office.   Ms. Joselynne Killam denied exposure to environmental toxins or chemical agents. She denied the use of alcohol, tobacco or street drugs. She reported that she went to the hospital at [redacted] weeks gestation and received fluids. She also stated that she had a fever at that time which possibly went above 102 degrees Fahrenheit. Available animal and human study data on elevated maternal body temperature (hyperthermia) during pregnancy suggest an association with increased risk for birth defects, particularly open neural tube defects when the maternal body temperature is at or above 102 degrees F. Given the timing and possible degree of Ms. Enriquez-Madariaga's fever, the chance for these particular type of birth defects may be slightly increased. However, we discussed available screening options in  the pregnancy to screen for open neural tube defects, including AFP maternal serum screen and targeted ultrasound. Her medical and surgical histories were contributory for recent cerclage placement in pregnancy.   I counseled Ms. Berdene Enriquez-Madariaga regarding the above risks and available options.  The approximate face-to-face time with the genetic counselor was 45 minutes.  Chipper Oman, MS Certified Genetic Counselor 12/30/2015

## 2016-01-04 ENCOUNTER — Telehealth: Payer: Self-pay | Admitting: General Practice

## 2016-01-04 DIAGNOSIS — B9689 Other specified bacterial agents as the cause of diseases classified elsewhere: Secondary | ICD-10-CM

## 2016-01-04 DIAGNOSIS — N760 Acute vaginitis: Principal | ICD-10-CM

## 2016-01-04 MED ORDER — METRONIDAZOLE 500 MG PO TABS: 500.0000 mg | ORAL_TABLET | Freq: Two times a day (BID) | ORAL | Status: DC

## 2016-01-04 NOTE — Telephone Encounter (Signed)
Called patient with pacific interpreter [phone] and informed her of wet prep results & medication sent to pharmacy. Patient verbalized understanding & had no questions

## 2016-01-12 ENCOUNTER — Ambulatory Visit (HOSPITAL_COMMUNITY)
Admission: RE | Admit: 2016-01-12 | Discharge: 2016-01-12 | Disposition: A | Discharge status: Home / Self Care | Payer: Self-pay | Source: Ambulatory Visit | Attending: Obstetrics and Gynecology | Admitting: Obstetrics and Gynecology

## 2016-01-12 ENCOUNTER — Other Ambulatory Visit (HOSPITAL_COMMUNITY): Payer: Self-pay | Admitting: Maternal and Fetal Medicine

## 2016-01-12 ENCOUNTER — Encounter (HOSPITAL_COMMUNITY): Payer: Self-pay

## 2016-01-12 DIAGNOSIS — O3442 Maternal care for other abnormalities of cervix, second trimester: Secondary | ICD-10-CM | POA: Insufficient documentation

## 2016-01-12 DIAGNOSIS — O3432 Maternal care for cervical incompetence, second trimester: Secondary | ICD-10-CM | POA: Insufficient documentation

## 2016-01-12 DIAGNOSIS — O34219 Maternal care for unspecified type scar from previous cesarean delivery: Secondary | ICD-10-CM

## 2016-01-12 DIAGNOSIS — Z3A19 19 weeks gestation of pregnancy: Secondary | ICD-10-CM

## 2016-01-12 DIAGNOSIS — O3430 Maternal care for cervical incompetence, unspecified trimester: Secondary | ICD-10-CM

## 2016-01-12 DIAGNOSIS — Z36 Encounter for antenatal screening of mother: Secondary | ICD-10-CM | POA: Insufficient documentation

## 2016-01-12 DIAGNOSIS — O09299 Supervision of pregnancy with other poor reproductive or obstetric history, unspecified trimester: Secondary | ICD-10-CM

## 2016-01-12 DIAGNOSIS — O09292 Supervision of pregnancy with other poor reproductive or obstetric history, second trimester: Secondary | ICD-10-CM | POA: Insufficient documentation

## 2016-01-12 DIAGNOSIS — Z1389 Encounter for screening for other disorder: Secondary | ICD-10-CM

## 2016-01-16 ENCOUNTER — Other Ambulatory Visit (HOSPITAL_COMMUNITY): Payer: Self-pay | Admitting: *Deleted

## 2016-01-16 DIAGNOSIS — O09219 Supervision of pregnancy with history of pre-term labor, unspecified trimester: Secondary | ICD-10-CM

## 2016-01-20 ENCOUNTER — Inpatient Hospital Stay (HOSPITAL_COMMUNITY)
Admission: AD | Admit: 2016-01-20 | Discharge: 2016-01-20 | Disposition: A | Discharge status: Home / Self Care | Payer: Self-pay | Source: Ambulatory Visit | Attending: Obstetrics & Gynecology | Admitting: Obstetrics & Gynecology

## 2016-01-20 ENCOUNTER — Encounter (HOSPITAL_COMMUNITY): Payer: Self-pay | Admitting: *Deleted

## 2016-01-20 DIAGNOSIS — O34219 Maternal care for unspecified type scar from previous cesarean delivery: Secondary | ICD-10-CM

## 2016-01-20 DIAGNOSIS — Z880 Allergy status to penicillin: Secondary | ICD-10-CM | POA: Insufficient documentation

## 2016-01-20 DIAGNOSIS — Z3A20 20 weeks gestation of pregnancy: Secondary | ICD-10-CM | POA: Insufficient documentation

## 2016-01-20 DIAGNOSIS — O368120 Decreased fetal movements, second trimester, not applicable or unspecified: Secondary | ICD-10-CM | POA: Insufficient documentation

## 2016-01-20 NOTE — MAU Provider Note (Signed)
History     CSN: [id number]  Arrival date and time: 01/20/16 [id number]   First Provider Initiated Contact with Patient 01/20/16 0615     Chief Complaint: Decreased Fetal Movement  HPI  Amy [name] is a [id number] at 20weeks and 4 days presenting for decreased fetal movement. States she noticed the baby's movements did not wake her from sleep last night. When she woke up at 4am, she immediately came to the MAU before eating or drinking. First noted fetal movement at [redacted] weeks gestation. Denies vaginal bleeding or loss of fluid. Denies contractions. [facility]  OB History    Gravida Para Term Preterm AB TAB SAB Ectopic Multiple Living   6 5 4 1  0 0 0 0 0 4      Past Medical History  Diagnosis Date  . Medical history non-contributory   . Preterm labor     Past Surgical History  Procedure Laterality Date  . Cesarean section      one previous  . Cervical cerclage N/A 01/13/2014    Procedure: CERCLAGE CERVICAL;  Surgeon: Adam PhenixJames G Arnold, MD;  Location: [facility];  Service: Gynecology;  Laterality: N/A;  . Cervical cerclage N/A 12/22/2015    Procedure: CERCLAGE CERVICAL;  Surgeon: Willodean Rosenthalarolyn Harraway-Smith, MD;  Location: [facility]S;  Service: Gynecology;  Laterality: N/A;    Family History  Problem Relation Age of Onset  . Diabetes Father   . Diabetes Sister   . Hypertension Sister   . Heart disease Sister     has pacemaker  . Cancer Sister     Social History  Substance Use Topics  . Smoking status: Never Smoker   . Smokeless tobacco: Never Used  . Alcohol Use: No    Allergies:  Allergies  Allergen Reactions  . Penicillins Rash    Has patient had a PCN reaction causing immediate rash, facial/tongue/throat swelling, SOB or lightheadedness with hypotension: Yes Has patient had a PCN reaction causing severe rash involving mucus membranes or skin necrosis: Yes Has patient had a PCN reaction that required hospitalization No Has patient had  a PCN reaction occurring within the last 10 years: No If all of the above answers are "NO", then may proceed with Cephalosporin use.     Prescriptions prior to admission  Medication Sig Dispense Refill Last Dose  . Prenatal Vit-Fe Fumarate-FA (PRENATAL MULTIVITAMIN) TABS tablet Take 1 tablet by mouth daily at 12 noon.   01/19/2016 at Unknown time  . metroNIDAZOLE (FLAGYL) 500 MG tablet Take 1 tablet (500 mg total) by mouth 2 (two) times daily. (Patient not taking: Reported on 01/12/2016) 14 tablet 0 Not Taking  . oxyCODONE-acetaminophen (PERCOCET/ROXICET) 5-325 MG tablet Take 1-2 tablets by mouth every 6 (six) hours as needed. (Patient not taking: Reported on 01/12/2016) 10 tablet 0 Not Taking  . promethazine (PHENERGAN) 25 MG tablet Take 1 tablet (25 mg total) by mouth every 6 (six) hours as needed for nausea or vomiting. (Patient not taking: Reported on 12/26/2015) 30 tablet 0 Not Taking    ROS Physical Exam   Blood pressure 100/61, pulse 87, temperature 97.7 F (36.5 C), temperature source Oral, resp. rate 16, height 4' 11.5" (1.511 m), weight 164 lb (74.39 kg), last menstrual period 08/29/2015, SpO2 100 %, currently breastfeeding.  Physical Exam  Constitutional: She appears well-developed and well-nourished. No distress.  Cardiovascular: Normal rate and regular rhythm.   Respiratory: Effort normal. No respiratory distress.  Musculoskeletal: She exhibits no edema.  Psychiatric:  She has a normal mood and affect. Her behavior is normal.   MAU Course  Procedures  MDM Apple Juice and Crackers given in MAU  Fetal Doppler 151  Assessment and Plan  # Decreased Fetal Movement: Discussed that daily regular fetal movement is not normally noted until 24weeks. Denies contractions, vaginal bleeding, or loss of fluid. Fetal doppler 151. Stable for discharge.   [facility] 01/20/2016, 6:15 AM   I have discussed the above pt and agree with the plan

## 2016-01-20 NOTE — Discharge Instructions (Signed)
Usually patients first begin to feel baby moving at 20weeks, but regular daily movement is not common until 24weeks. If you are concerned about lack of fetal movement, you may call your doctor to see if they feel it is appropriate for you to come to the MAU.

## 2016-01-20 NOTE — MAU Note (Signed)
[facility]  [facility]-     DONE MID April.    NO BLEEDING.      WHEN SHE WENT TO BED  AT 2330- SHE FELT  BABY MOVE.      SHE AWOKE AT 0400 AND DID NOT FEEL BABY MOVE .    STILL DOES  NOT FEEL BABY  MOVE.     DID NOT CALL  NURSE LINE.    USING DEBBIE - INTERPRETER.

## 2016-01-20 NOTE — MAU Note (Signed)
CRACKERS/ PB AND 3RD APPLE JUICE

## 2016-01-20 NOTE — MAU Note (Signed)
PO APPLE JUICE

## 2016-01-20 NOTE — MAU Note (Signed)
Pt reports she has not felt the baby move since she went to bed last pm.

## 2016-01-24 ENCOUNTER — Ambulatory Visit (INDEPENDENT_AMBULATORY_CARE_PROVIDER_SITE_OTHER): Payer: Self-pay | Admitting: Advanced Practice Midwife

## 2016-01-24 VITALS — BP 105/65 | HR 79 | Wt 164.3 lb

## 2016-01-24 DIAGNOSIS — O34219 Maternal care for unspecified type scar from previous cesarean delivery: Secondary | ICD-10-CM

## 2016-01-24 DIAGNOSIS — O352XX0 Maternal care for (suspected) hereditary disease in fetus, not applicable or unspecified: Secondary | ICD-10-CM

## 2016-01-24 DIAGNOSIS — N898 Other specified noninflammatory disorders of vagina: Secondary | ICD-10-CM

## 2016-01-24 DIAGNOSIS — L309 Dermatitis, unspecified: Secondary | ICD-10-CM

## 2016-01-24 DIAGNOSIS — O3442 Maternal care for other abnormalities of cervix, second trimester: Secondary | ICD-10-CM

## 2016-01-24 DIAGNOSIS — O3432 Maternal care for cervical incompetence, second trimester: Secondary | ICD-10-CM

## 2016-01-24 DIAGNOSIS — O352XX1 Maternal care for (suspected) hereditary disease in fetus, fetus 1: Secondary | ICD-10-CM

## 2016-01-24 DIAGNOSIS — O26892 Other specified pregnancy related conditions, second trimester: Secondary | ICD-10-CM

## 2016-01-24 DIAGNOSIS — O0992 Supervision of high risk pregnancy, unspecified, second trimester: Secondary | ICD-10-CM

## 2016-01-24 LAB — POCT URINALYSIS DIP (DEVICE)
BILIRUBIN URINE: NEGATIVE
GLUCOSE, UA: NEGATIVE mg/dL
HGB URINE DIPSTICK: NEGATIVE
LEUKOCYTES UA: NEGATIVE
NITRITE: NEGATIVE
Protein, ur: NEGATIVE mg/dL
Specific Gravity, Urine: >=1.03 (ref 1.005–1.030)
Urobilinogen, UA: 0.2 mg/dL (ref 0.0–1.0)
pH: 5.5 (ref 5.0–8.0)

## 2016-01-24 MED ORDER — HYDROCORTISONE 0.5 % EX CREA: 1.0000 "application " | TOPICAL_CREAM | Freq: Two times a day (BID) | CUTANEOUS | Status: DC

## 2016-01-24 NOTE — Progress Notes (Signed)
Need u/a, pt c/o burning with urination Patient has an itchy rash on legs and arms Breastfeeding tip reviewed "Amy [name]" [phone] Spanish video interpreter used

## 2016-01-24 NOTE — Progress Notes (Deleted)
Subjective:  Amy [name] is a 35 y.o. [id number] at 56101w1d being seen today for ongoing prenatal care.  She is currently monitored for the following issues for this {Blank single:19197::"high-risk","low-risk"} pregnancy and has Tinea versicolor; Cervical insufficiency during pregnancy, antepartum; Previous cesarean delivery, antepartum condition or complication; Supervision of high risk pregnancy, antepartum; and Previous child with congenital anomaly, currently pregnant, antepartum on her problem list.  Patient reports {sx:[id number]}.   .  .   . Denies leaking of fluid.   The following portions of the patient's history were reviewed and updated as appropriate: allergies, current medications, past family history, past medical history, past social history, past surgical history and problem list. Problem list updated.  Objective:  There were no vitals filed for this visit.  Fetal Status:           General:  Alert, oriented and cooperative. Patient is in no acute distress.  Skin: Skin is warm and dry. No rash noted.   Cardiovascular: Normal heart rate noted  Respiratory: Normal respiratory effort, no problems with respiration noted  Abdomen: Soft, gravid, appropriate for gestational age.       Pelvic:       {Blank single:19197::"Cervical exam performed","Cervical exam deferred"}        Extremities: Normal range of motion.     Mental Status: Normal mood and affect. Normal behavior. Normal judgment and thought content.   Urinalysis:      Assessment and Plan:  Pregnancy: [id number] at 49101w1d  1. Cervical insufficiency during pregnancy, antepartum, second trimester ***  2. Previous cesarean delivery, antepartum condition or complication ***  3. Previous child with congenital anomaly, currently pregnant, antepartum, fetus 1 ***  4. Supervision of high risk pregnancy, antepartum, second trimester ***  {Blank single:19197::"Term","Preterm"} labor symptoms and general obstetric  precautions including but not limited to vaginal bleeding, contractions, leaking of fluid and fetal movement were reviewed in detail with the patient. Please refer to After Visit Summary for other counseling recommendations.  No Follow-up on file.   Federico FlakeKimberly Niles Shadia Larose, MD

## 2016-01-24 NOTE — Patient Instructions (Signed)

## 2016-01-24 NOTE — Progress Notes (Signed)
Subjective:  Amy [name] is a 35 y.o. [id number] at 6769w1d being seen today for ongoing prenatal care.  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  Patient reports rash on arms and legs, white vaginal discharge "like a paste" with some burning, no itching.   . Vag. Bleeding: None.  Movement: Present. Denies leaking of fluid.   The following portions of the patient's history were reviewed and updated as appropriate: allergies, current medications, past family history, past medical history, past social history, past surgical history and problem list. Problem list updated.  Objective:   Filed Vitals:   01/24/16 1034  BP: 105/65  Pulse: 79  Weight: 164 lb 4.8 oz (74.526 kg)    Fetal Status: Fetal Heart Rate (bpm): 140   Movement: Present     General:  Alert, oriented and cooperative. Patient is in no acute distress.  Skin: Skin is warm and dry. No rash noted.   Cardiovascular: Normal heart rate noted  Respiratory: Normal respiratory effort, no problems with respiration noted  Abdomen: Soft, gravid, appropriate for gestational age. Pain/Pressure: Present     Pelvic: Vag. Bleeding: None Vag D/C Character: White   Pelvic exam with visually closed cervix, cerclage in place wnl, white mucus-like discharge, no pooling of fluid with valsalva, no erythema or edema, no bleeding noted        Extremities: Normal range of motion.  Edema: Trace  Mental Status: Normal mood and affect. Normal behavior. Normal judgment and thought content.   Urinalysis:      Assessment and Plan:  Pregnancy: [id number] at 4969w1d  1. Cervical insufficiency during pregnancy, antepartum, second  trimester -Cerclage wnl on today's exam  2. Previous cesarean delivery, antepartum condition or complication   3. Previous child with congenital anomaly, currently pregnant, antepartum, fetus 1 --Genetic counseling done.  Quad/AFP normal/low risk.  4. Supervision of high risk pregnancy, antepartum, second trimester   5. Eczema --Multiple round dry patches of skin on arms and legs c/w eczema.  Pt reports hx of this diagnosis but no current treatment/management.  Use hydrocortisone cream until rash subsides, then daily lotion. - hydrocortisone cream 0.5 %; Apply 1 application topically 2 (two) times daily.  Dispense: 30 g; Refill: 0  6. Vaginal discharge during pregnancy, second trimester --No evidence of ROM - Wet prep, genital  Preterm labor symptoms and general obstetric precautions including but not limited to vaginal bleeding, contractions, leaking of fluid and fetal movement were reviewed in detail with the patient. Please refer to After Visit Summary for other counseling recommendations.  Return in about 4 weeks (around 02/21/2016) for Routine prenatal care.   Hurshel PartyLisa A Leftwich-Kirby, CNM

## 2016-01-25 ENCOUNTER — Other Ambulatory Visit: Payer: Self-pay

## 2016-01-25 LAB — WET PREP, GENITAL
TRICH WET PREP: NONE SEEN
YEAST WET PREP: NONE SEEN

## 2016-01-25 MED ORDER — METRONIDAZOLE 500 MG PO TABS: 500.0000 mg | ORAL_TABLET | Freq: Two times a day (BID) | ORAL | Status: DC

## 2016-01-25 NOTE — Telephone Encounter (Signed)
Lab results and to start medication.

## 2016-01-25 NOTE — Telephone Encounter (Signed)
Pt has been informed of

## 2016-01-26 ENCOUNTER — Telehealth: Payer: Self-pay | Admitting: General Practice

## 2016-01-26 NOTE — Telephone Encounter (Signed)
Per Misty StanleyLisa, patient has BV and med has been sent to pharmacy. Called patient with Raquel for interpreter & informed her of results & recommendations. Patient verbalized understanding & had no questions

## 2016-01-27 ENCOUNTER — Encounter (HOSPITAL_COMMUNITY): Payer: Self-pay

## 2016-01-27 ENCOUNTER — Ambulatory Visit (HOSPITAL_COMMUNITY)
Admission: RE | Admit: 2016-01-27 | Discharge: 2016-01-27 | Disposition: A | Discharge status: Home / Self Care | Payer: Self-pay | Source: Ambulatory Visit | Attending: Obstetrics and Gynecology | Admitting: Obstetrics and Gynecology

## 2016-01-27 DIAGNOSIS — Z3A21 21 weeks gestation of pregnancy: Secondary | ICD-10-CM | POA: Insufficient documentation

## 2016-01-27 DIAGNOSIS — O09219 Supervision of pregnancy with history of pre-term labor, unspecified trimester: Secondary | ICD-10-CM

## 2016-01-27 DIAGNOSIS — O09212 Supervision of pregnancy with history of pre-term labor, second trimester: Secondary | ICD-10-CM | POA: Insufficient documentation

## 2016-02-09 ENCOUNTER — Encounter (HOSPITAL_COMMUNITY): Payer: Self-pay

## 2016-02-09 ENCOUNTER — Ambulatory Visit (HOSPITAL_COMMUNITY)
Admission: RE | Admit: 2016-02-09 | Discharge: 2016-02-09 | Disposition: A | Discharge status: Home / Self Care | Payer: Self-pay | Source: Ambulatory Visit | Attending: Obstetrics and Gynecology | Admitting: Obstetrics and Gynecology

## 2016-02-09 ENCOUNTER — Other Ambulatory Visit (HOSPITAL_COMMUNITY): Payer: Self-pay | Admitting: Obstetrics and Gynecology

## 2016-02-09 DIAGNOSIS — O3432 Maternal care for cervical incompetence, second trimester: Secondary | ICD-10-CM | POA: Insufficient documentation

## 2016-02-09 DIAGNOSIS — Z3A23 23 weeks gestation of pregnancy: Secondary | ICD-10-CM | POA: Insufficient documentation

## 2016-02-09 DIAGNOSIS — O34219 Maternal care for unspecified type scar from previous cesarean delivery: Secondary | ICD-10-CM | POA: Insufficient documentation

## 2016-02-09 DIAGNOSIS — Z0489 Encounter for examination and observation for other specified reasons: Secondary | ICD-10-CM

## 2016-02-09 DIAGNOSIS — N841 Polyp of cervix uteri: Secondary | ICD-10-CM

## 2016-02-09 DIAGNOSIS — O3442 Maternal care for other abnormalities of cervix, second trimester: Secondary | ICD-10-CM | POA: Insufficient documentation

## 2016-02-09 DIAGNOSIS — O09292 Supervision of pregnancy with other poor reproductive or obstetric history, second trimester: Secondary | ICD-10-CM

## 2016-02-09 DIAGNOSIS — IMO0002 Reserved for concepts with insufficient information to code with codable children: Secondary | ICD-10-CM

## 2016-02-09 DIAGNOSIS — Z36 Encounter for antenatal screening of mother: Secondary | ICD-10-CM | POA: Insufficient documentation

## 2016-02-09 DIAGNOSIS — O09219 Supervision of pregnancy with history of pre-term labor, unspecified trimester: Secondary | ICD-10-CM

## 2016-02-21 ENCOUNTER — Ambulatory Visit (INDEPENDENT_AMBULATORY_CARE_PROVIDER_SITE_OTHER): Payer: Self-pay | Admitting: Student

## 2016-02-21 VITALS — BP 84/49 | HR 80 | Wt 172.0 lb

## 2016-02-21 DIAGNOSIS — O0992 Supervision of high risk pregnancy, unspecified, second trimester: Secondary | ICD-10-CM

## 2016-02-21 DIAGNOSIS — L309 Dermatitis, unspecified: Secondary | ICD-10-CM

## 2016-02-21 DIAGNOSIS — O3432 Maternal care for cervical incompetence, second trimester: Secondary | ICD-10-CM

## 2016-02-21 DIAGNOSIS — O3442 Maternal care for other abnormalities of cervix, second trimester: Secondary | ICD-10-CM

## 2016-02-21 DIAGNOSIS — N898 Other specified noninflammatory disorders of vagina: Secondary | ICD-10-CM

## 2016-02-21 DIAGNOSIS — O26892 Other specified pregnancy related conditions, second trimester: Secondary | ICD-10-CM

## 2016-02-21 LAB — POCT URINALYSIS DIP (DEVICE)
Bilirubin Urine: NEGATIVE
Glucose, UA: NEGATIVE mg/dL
Hgb urine dipstick: NEGATIVE
Ketones, ur: NEGATIVE mg/dL
Leukocytes, UA: NEGATIVE
Nitrite: NEGATIVE
Protein, ur: NEGATIVE mg/dL
Specific Gravity, Urine: 1.02 (ref 1.005–1.030)
Urobilinogen, UA: 0.2 mg/dL (ref 0.0–1.0)
pH: 7 (ref 5.0–8.0)

## 2016-02-21 MED ORDER — HYDROCORTISONE 0.5 % EX CREA: 1.0000 "application " | TOPICAL_CREAM | Freq: Two times a day (BID) | CUTANEOUS | Status: DC

## 2016-02-21 NOTE — Patient Instructions (Addendum)
Informacin sobre el parto prematuro  (Preterm Labor Information) El parto prematuro comienza antes de la semana 37 de embarazo. La duracin de un embarazo normal es de 39 a 41 semanas.  CAUSAS  Generalmente no hay una causa que pueda identificarse del motivo por el que una mujer comienza un trabajo de parto prematuro. Sin embargo, una de las causas conocidas ms frecuentes son las infecciones. Las infecciones del tero, el cuello, la vagina, el lquido amnitico, la vejiga, los riones y hasta de los pulmones (neumona) pueden hacer que el trabajo de parto se inicie. Otras causas que pueden sospecharse son:   Infecciones urogenitales, como infecciones por hongos y vaginosis bacteriana.   Anormalidades uterinas (forma del tero, sptum uterino, fibromas, hemorragias en la placenta).   Un cuello que ha sido operado (puede ser que no permanezca cerrado).   Malformaciones del feto.   Gestaciones mltiples (mellizos, trillizos y ms).   Ruptura del saco amnitico.  FACTORES DE RIESGO   Historia previa de parto prematuro.   Tener ruptura prematura de las membranas (RPM).   La placenta cubre la abertura del cuello (placenta previa).   La placenta se separa del tero (abrupcin placentaria).   El cuello es demasiado dbil para contener al beb en el tero (cuello incompetente).   Hay mucho lquido en el saco amnitico (polihidramnios).   Consumo de drogas o hbito de fumar durante el embarazo.   No aumentar de peso lo suficiente durante el embarazo.   Mujeres menores de 18 aos o mayores de 35 aos.   Nivel socioeconmico bajo.   Pertenecer a la raza afroamericana. SNTOMAS  Los signos y sntomas del trabajo de parto prematuro son:   Clicos similares a los menstruales, dolor abdominal o dolor de espalda.  Contracciones uterinas regulares, tan frecuentes como seis por hora, sin importar su intensidad (pueden ser suaves o dolorosas).  Contracciones que comienzan  en la parte superior del tero y se expanden hacia abajo, a la zona inferior del abdomen y la espalda.   Sensacin de aumento de presin en la pelvis.   Aparece una secrecin acuosa o sanguinolenta por la vagina.  TRATAMIENTO  Segn el tiempo del embarazo y otras circunstancias, el mdico puede indicar reposo en cama. Si es necesario, le indicarn medicamentos para detener las contracciones y para madurar los pulmones del feto. Si el trabajo de parto se inicia antes de las 34 semanas de embarazo, se recomienda la hospitalizacin. El tratamiento depende de las condiciones en que se encuentren usted y el feto.  QU DEBE HACER SI PIENSA QUE EST EN TRABAJO DE PARTO PREMATURO?  Comunquese con su mdico inmediatamente. Debe concurrir al hospital para ser controlada inmediatamente.  CMO PUEDE EVITAR EL TRABAJO DE PARTO PREMATURO EN FUTUROS EMBARAZOS?  Usted debe:   Si fuma, abandonar el hbito.  Mantener un peso saludable y evitar sustancias qumicas y drogas innecesarias.  Controlar todo tipo de infeccin.  Informe a su mdico si tiene una historia conocida de trabajo de parto prematuro.   Esta informacin no tiene como fin reemplazar el consejo del mdico. Asegrese de hacerle al mdico cualquier pregunta que tenga.   Document Released: 11/20/2007 Document Revised: 04/15/2013 Elsevier Interactive Patient Education 2016 Elsevier Inc.  

## 2016-02-21 NOTE — Progress Notes (Signed)
Video Interpreter [phone] Pt reports not picking up hydrocortisone cream but needs a reorder

## 2016-02-21 NOTE — Progress Notes (Signed)
Subjective:  Amy [name] is a 35 y.o. [id number] at 2672w1d being seen today for ongoing prenatal care.  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  Patient reports vaginal discharge. Reports getting recurrent BV infection. Describes thin white discharge. No irritaiton. No vaginal bleeding or abdominal pain. .  Contractions: Not present. Vag. Bleeding: None.  Movement: Present. Denies leaking of fluid.   The following portions of the patient's history were reviewed and updated as appropriate: allergies, current medications, past family history, past medical history, past social history, past surgical history and problem list. Problem list updated.  Objective:   Filed Vitals:   02/21/16 1419  BP: 84/49  Pulse: 80  Weight: 172 lb (78.019 kg)    Fetal Status: Fetal Heart Rate (bpm): 150   Movement: Present     General:  Alert, oriented and cooperative. Patient is in no acute distress.  Skin: Skin is warm and dry. No rash noted.   Cardiovascular: Normal heart rate noted  Respiratory: Normal respiratory effort, no problems with respiration noted  Abdomen: Soft, gravid, appropriate for gestational age. Pain/Pressure: Present      FH 25 cm  Pelvic: Cervical exam deferred        Extremities: Normal range of motion.  Edema: Trace  Mental Status: Normal mood and affect. Normal behavior. Normal judgment and thought content.   Urinalysis:      Assessment and Plan:  Pregnancy: [id number] at 5972w1d  1. Supervision of high risk pregnancy, antepartum, second trimester   2. Cervical insufficiency during pregnancy, antepartum, second trimester -Scheduled for f/u  ultrasound to check CL  3. Vaginal discharge during pregnancy, second trimester  - Wet prep, genital  4. Eczema -Previously prescribed -- pt didn't pick up rx - hydrocortisone cream 0.5 %; Apply 1 application topically 2 (two) times daily.  Dispense: 30 g; Refill: 0  Preterm labor symptoms and general obstetric precautions including but not limited to vaginal bleeding, contractions, leaking of fluid and fetal movement were reviewed in detail with the patient. Please refer to After Visit Summary for other counseling recommendations.  Return in about 4 weeks (around 03/20/2016) for Routine OB.   Judeth HornErin London Nonaka, NP

## 2016-02-22 LAB — WET PREP, GENITAL
Trich, Wet Prep: NONE SEEN
YEAST WET PREP: NONE SEEN

## 2016-03-08 ENCOUNTER — Other Ambulatory Visit (HOSPITAL_COMMUNITY): Payer: Self-pay | Admitting: Obstetrics and Gynecology

## 2016-03-08 ENCOUNTER — Encounter (HOSPITAL_COMMUNITY): Payer: Self-pay

## 2016-03-08 ENCOUNTER — Ambulatory Visit (HOSPITAL_COMMUNITY)
Admission: RE | Admit: 2016-03-08 | Discharge: 2016-03-08 | Disposition: A | Discharge status: Home / Self Care | Payer: Self-pay | Source: Ambulatory Visit | Attending: Obstetrics and Gynecology | Admitting: Obstetrics and Gynecology

## 2016-03-08 DIAGNOSIS — O3432 Maternal care for cervical incompetence, second trimester: Secondary | ICD-10-CM

## 2016-03-08 DIAGNOSIS — O34219 Maternal care for unspecified type scar from previous cesarean delivery: Secondary | ICD-10-CM

## 2016-03-08 DIAGNOSIS — Z3A27 27 weeks gestation of pregnancy: Secondary | ICD-10-CM

## 2016-03-08 DIAGNOSIS — O3442 Maternal care for other abnormalities of cervix, second trimester: Secondary | ICD-10-CM

## 2016-03-08 DIAGNOSIS — O09292 Supervision of pregnancy with other poor reproductive or obstetric history, second trimester: Secondary | ICD-10-CM | POA: Insufficient documentation

## 2016-03-08 DIAGNOSIS — N841 Polyp of cervix uteri: Secondary | ICD-10-CM

## 2016-03-08 DIAGNOSIS — O402XX0 Polyhydramnios, second trimester, not applicable or unspecified: Secondary | ICD-10-CM | POA: Insufficient documentation

## 2016-03-27 ENCOUNTER — Ambulatory Visit (INDEPENDENT_AMBULATORY_CARE_PROVIDER_SITE_OTHER): Payer: Self-pay | Admitting: Family

## 2016-03-27 VITALS — BP 93/54 | HR 75 | Wt 178.5 lb

## 2016-03-27 DIAGNOSIS — O34219 Maternal care for unspecified type scar from previous cesarean delivery: Secondary | ICD-10-CM

## 2016-03-27 DIAGNOSIS — O3442 Maternal care for other abnormalities of cervix, second trimester: Secondary | ICD-10-CM

## 2016-03-27 DIAGNOSIS — Z23 Encounter for immunization: Secondary | ICD-10-CM

## 2016-03-27 DIAGNOSIS — O0993 Supervision of high risk pregnancy, unspecified, third trimester: Secondary | ICD-10-CM

## 2016-03-27 DIAGNOSIS — O3432 Maternal care for cervical incompetence, second trimester: Secondary | ICD-10-CM

## 2016-03-27 LAB — POCT URINALYSIS DIP (DEVICE)
BILIRUBIN URINE: NEGATIVE
Glucose, UA: NEGATIVE mg/dL
HGB URINE DIPSTICK: NEGATIVE
KETONES UR: NEGATIVE mg/dL
Nitrite: NEGATIVE
PH: 6 (ref 5.0–8.0)
Protein, ur: NEGATIVE mg/dL
SPECIFIC GRAVITY, URINE: 1.025 (ref 1.005–1.030)
Urobilinogen, UA: 0.2 mg/dL (ref 0.0–1.0)

## 2016-03-27 LAB — CBC
HEMATOCRIT: 33.8 % — AB (ref 35.0–45.0)
HEMOGLOBIN: 10.7 g/dL — AB (ref 11.7–15.5)
MCH: 26.6 pg — ABNORMAL LOW (ref 27.0–33.0)
MCHC: 31.7 g/dL — ABNORMAL LOW (ref 32.0–36.0)
MCV: 83.9 fL (ref 80.0–100.0)
MPV: 11.3 fL (ref 7.5–12.5)
Platelets: 203 10*3/uL (ref 140–400)
RBC: 4.03 MIL/uL (ref 3.80–5.10)
RDW: 13.9 % (ref 11.0–15.0)
WBC: 8.7 10*3/uL (ref 3.8–10.8)

## 2016-03-27 MED ORDER — TETANUS-DIPHTH-ACELL PERTUSSIS 5-2.5-18.5 LF-MCG/0.5 IM SUSP
0.5000 mL | Freq: Once | INTRAMUSCULAR | Status: AC
  Administered 2016-03-27: 0.5 mL via INTRAMUSCULAR

## 2016-03-27 NOTE — Progress Notes (Signed)
Spanish interpreter "Adriana" [phone]

## 2016-03-27 NOTE — Progress Notes (Signed)
Subjective:  Amy [name] is a 35 y.o. [id number] at [redacted]w[redacted]d being seen today for ongoing prenatal care.  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  Patient reports no complaints.  Doing well.  Contractions: Not present. Vag. Bleeding: None.  Movement: Present. Denies leaking of fluid.   The following portions of the patient's history were reviewed and updated as appropriate: allergies, current medications, past family history, past medical history, past social history, past surgical history and problem list. Problem list updated.  Objective:   Vitals:   03/27/16 0816  BP: (!) 93/54  Pulse: 75  Weight: 178 lb 8 oz (81 kg)    Fetal Status: Fetal Heart Rate (bpm): 136   Movement: Present     General:  Alert, oriented and cooperative. Patient is in no acute distress.  Skin: Skin is warm and dry. No rash noted.   Cardiovascular: Normal heart rate noted  Respiratory: Normal respiratory effort, no problems with respiration noted  Abdomen: Soft, gravid, appropriate for gestational age. Pain/Pressure: Absent     Pelvic:  Cervical exam deferred        Extremities: Normal range of motion.  Edema: Trace  Mental Status: Normal mood and affect. Normal behavior. Normal judgment and thought content.   Urinalysis: Urine Protein: Negative Urine Glucose: Negative  Assessment and Plan:  Pregnancy: [id number] at [redacted]w[redacted]d  1. Cervical insufficiency during pregnancy, antepartum, second trimester - Cerclage in place; cervical length stable  2. Supervision of high risk pregnancy, antepartum, third trimester - Third trimester labs today; undecided regarding Tdap  3. Previous  cesarean delivery, antepartum condition or complication - Consent reviewed and signed today  Preterm labor symptoms and general obstetric precautions including but not limited to vaginal bleeding, contractions, leaking of fluid and fetal movement were reviewed in detail with the patient. Please refer to After Visit Summary for other counseling recommendations.  Return in about 2 weeks (around 04/10/2016).   Eino Farber Kennith Gain, CNM

## 2016-03-27 NOTE — Addendum Note (Signed)
Addended by: Garret Reddish on: 03/27/2016 10:29 AM   Modules accepted: Orders

## 2016-03-27 NOTE — Progress Notes (Signed)
All information obtained and convey via video interpreter [phone]

## 2016-03-28 LAB — GLUCOSE TOLERANCE, 1 HOUR (50G) W/O FASTING: Glucose, 1 Hr, gestational: 171 mg/dL — ABNORMAL HIGH (ref <140)

## 2016-03-28 LAB — HIV ANTIBODY (ROUTINE TESTING W REFLEX): HIV 1&2 Ab, 4th Generation: NONREACTIVE

## 2016-03-28 LAB — RPR

## 2016-03-29 ENCOUNTER — Encounter: Payer: Self-pay | Admitting: *Deleted

## 2016-04-02 ENCOUNTER — Telehealth: Payer: Self-pay

## 2016-04-02 NOTE — Telephone Encounter (Signed)
Pt returned call to the front desk and we informed pt that her one hour glucola is abnormal and the need for a 3 hr testing.  Pt informed by Alanda AmassBeronica that she needed to fast and remain in the facility the duration of the test.  Pt to schedule appt with Beronica.

## 2016-04-02 NOTE — Telephone Encounter (Signed)
LM for pt with Spanish Interpreter Celene SkeenBelen Watkins to return call in regards to results.

## 2016-04-04 ENCOUNTER — Other Ambulatory Visit: Payer: Self-pay

## 2016-04-04 DIAGNOSIS — R7309 Other abnormal glucose: Secondary | ICD-10-CM

## 2016-04-05 LAB — GLUCOSE TOLERANCE, 3 HOURS
GLUCOSE, 1 HOUR-GESTATIONAL: 137 mg/dL (ref <190)
GLUCOSE, 2 HOUR-GESTATIONAL: 122 mg/dL (ref <165)
GLUCOSE, FASTING-GESTATIONAL: 83 mg/dL (ref 65–104)
Glucose, GTT - 3 Hour: 131 mg/dL (ref <145)

## 2016-04-11 ENCOUNTER — Ambulatory Visit (INDEPENDENT_AMBULATORY_CARE_PROVIDER_SITE_OTHER): Payer: Self-pay | Admitting: Obstetrics and Gynecology

## 2016-04-11 VITALS — BP 94/55 | HR 70 | Wt 179.0 lb

## 2016-04-11 DIAGNOSIS — O34219 Maternal care for unspecified type scar from previous cesarean delivery: Secondary | ICD-10-CM

## 2016-04-11 DIAGNOSIS — O3433 Maternal care for cervical incompetence, third trimester: Secondary | ICD-10-CM

## 2016-04-11 DIAGNOSIS — O3443 Maternal care for other abnormalities of cervix, third trimester: Secondary | ICD-10-CM

## 2016-04-11 DIAGNOSIS — O0993 Supervision of high risk pregnancy, unspecified, third trimester: Secondary | ICD-10-CM

## 2016-04-11 LAB — POCT URINALYSIS DIP (DEVICE)
BILIRUBIN URINE: NEGATIVE
GLUCOSE, UA: NEGATIVE mg/dL
Hgb urine dipstick: NEGATIVE
Ketones, ur: NEGATIVE mg/dL
LEUKOCYTES UA: NEGATIVE
NITRITE: NEGATIVE
Protein, ur: 30 mg/dL — AB
SPECIFIC GRAVITY, URINE: 1.02 (ref 1.005–1.030)
UROBILINOGEN UA: 1 mg/dL (ref 0.0–1.0)
pH: 7 (ref 5.0–8.0)

## 2016-04-11 NOTE — Patient Instructions (Signed)
Regrese a la clinica cuando tenga su cita. Si tiene problemas o preguntas, llama a la clinica o vaya a la sala de emergencia al [facility].    

## 2016-04-11 NOTE — Progress Notes (Signed)
Subjective:  Amy [name] is a 35 y.o. [id number] at 664w2d being seen today for ongoing prenatal care.  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 Patient is Spanish-speaking only, Spanish interpreter present for this encounter.  Patient reports no complaints.  Contractions: Not present. Vag. Bleeding: None.  Movement: Present. Denies leaking of fluid.   The following portions of the patient's history were reviewed and updated as appropriate: allergies, current medications, past family history, past medical history, past social history, past surgical history and problem list. Problem list updated.  Objective:   Vitals:   04/11/16 1524  BP: (!) 94/55  Pulse: 70  Weight: 179 lb (81.2 kg)    Fetal Status: Fetal Heart Rate (bpm): 135 Fundal Height: 33 cm Movement: Present     General:  Alert, oriented and cooperative. Patient is in no acute distress.  Skin: Skin is warm and dry. No rash noted.   Cardiovascular: Normal heart rate noted  Respiratory: Normal respiratory effort, no problems with respiration noted  Abdomen: Soft, gravid, appropriate for gestational age. Pain/Pressure: Present     Pelvic:  Cervical exam deferred        Extremities: Normal range of motion.     Mental Status: Normal mood and affect. Normal behavior. Normal judgment and thought content.   Urinalysis:      Assessment and Plan:  Pregnancy: [id number] at 634w2d   1. Cervical insufficiency during pregnancy, antepartum, third trimester No concerns  2. Previous cesarean delivery, antepartum condition or complication Already consented for TOLAC  3. Supervision of high risk pregnancy,  antepartum, third trimester  Preterm labor symptoms and general obstetric precautions including but not limited to vaginal bleeding, contractions, leaking of fluid and fetal movement were reviewed in detail with the patient. Please refer to After Visit Summary for other counseling recommendations.  Return in about 2 weeks (around 04/25/2016) for OB Visit.   Tereso NewcomerUgonna A Anyanwu, MD

## 2016-05-01 ENCOUNTER — Encounter: Payer: Self-pay | Admitting: Advanced Practice Midwife

## 2016-05-15 ENCOUNTER — Ambulatory Visit (INDEPENDENT_AMBULATORY_CARE_PROVIDER_SITE_OTHER): Payer: Self-pay | Admitting: Family

## 2016-05-15 VITALS — BP 98/45 | HR 86 | Wt 185.5 lb

## 2016-05-15 DIAGNOSIS — O0993 Supervision of high risk pregnancy, unspecified, third trimester: Secondary | ICD-10-CM

## 2016-05-15 DIAGNOSIS — Z23 Encounter for immunization: Secondary | ICD-10-CM

## 2016-05-15 DIAGNOSIS — O3443 Maternal care for other abnormalities of cervix, third trimester: Secondary | ICD-10-CM

## 2016-05-15 DIAGNOSIS — O3433 Maternal care for cervical incompetence, third trimester: Secondary | ICD-10-CM

## 2016-05-15 DIAGNOSIS — O34219 Maternal care for unspecified type scar from previous cesarean delivery: Secondary | ICD-10-CM

## 2016-05-15 LAB — POCT URINALYSIS DIP (DEVICE)
Bilirubin Urine: NEGATIVE
GLUCOSE, UA: NEGATIVE mg/dL
Hgb urine dipstick: NEGATIVE
Ketones, ur: NEGATIVE mg/dL
Leukocytes, UA: NEGATIVE
Nitrite: NEGATIVE
PH: 6 (ref 5.0–8.0)
PROTEIN: 30 mg/dL — AB
SPECIFIC GRAVITY, URINE: 1.02 (ref 1.005–1.030)
UROBILINOGEN UA: 1 mg/dL (ref 0.0–1.0)

## 2016-05-15 NOTE — Progress Notes (Signed)
   PRENATAL VISIT NOTE  Subjective:  Amy [name] is a 35 y.o. [id number] at 7460w1d being seen today for ongoing prenatal care.  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  Patient reports occasional contractions.  Contractions: Irregular. Vag. Bleeding: None.  Movement: Present. Denies leaking of fluid.   The following portions of the patient's history were reviewed and updated as appropriate: allergies, current medications, past family history, past medical history, past social history, past surgical history and problem list. Problem list updated.  Objective:   Vitals:   05/15/16 1012  BP: (!) 98/45  Pulse: 86  Weight: 185 lb 8 oz (84.1 kg)    Fetal Status: Fetal Heart Rate (bpm): 138   Movement: Present     General:  Alert, oriented and cooperative. Patient is in no acute distress.  Skin: Skin is warm and dry. No rash noted.   Cardiovascular: Normal heart rate noted  Respiratory: Normal respiratory effort, no problems with respiration noted  Abdomen: Soft, gravid, appropriate for gestational age. Pain/Pressure: Present     Pelvic:  Cervical exam deferred        Extremities: Normal range of motion.  Edema: Mild pitting, slight indentation  Mental Status: Normal mood and affect. Normal behavior. Normal judgment and thought content.  Cerclage removal:  Consent obtained, sterile speculum inserted, strings visualized; Dr. Vergie LivingPickens called to assist due to inability to see "v".  Able to cut suture with the assistance of Dr. Vergie LivingPickens, no bleeding noted.  Patient tolerated the procedure well.    Urinalysis: Urine Protein: 1+ Urine Glucose: Negative  Assessment  and Plan:  Pregnancy: [id number] at 7160w1d  1. Needs flu shot - Flu Vaccine QUAD 36+ mos IM (Fluarix, Quad PF)  2. Supervision of high risk pregnancy, antepartum, third trimester - GBS collected; GC/CT on urine  3. Previous cesarean delivery, antepartum condition or complication - Consent under media tab  4. Cervical insufficiency during pregnancy, antepartum, third trimester - Cerclage removed at today's visit; Explained spotting of blood common; Reviewed signs of preterm labor.  Preterm labor symptoms and general obstetric precautions including but not limited to vaginal bleeding, contractions, leaking of fluid and fetal movement were reviewed in detail with the patient. Please refer to After Visit Summary for other counseling recommendations.  Return in about 1 week (around 05/22/2016).  Eino FarberWalidah Kennith GainN Karim, CNM   *All information conveyed and obtained with use of video interpreter.

## 2016-05-15 NOTE — Progress Notes (Signed)
Flu vaccine today Spanish video interpreter "Maurine MinisterDennis" [phone]

## 2016-05-21 ENCOUNTER — Inpatient Hospital Stay (HOSPITAL_COMMUNITY)
Admission: AD | Admit: 2016-05-21 | Discharge: 2016-05-21 | Disposition: A | Discharge status: Home / Self Care | Payer: Self-pay | Source: Ambulatory Visit | Attending: Family Medicine | Admitting: Family Medicine

## 2016-05-21 ENCOUNTER — Encounter (HOSPITAL_COMMUNITY): Payer: Self-pay

## 2016-05-21 DIAGNOSIS — O0993 Supervision of high risk pregnancy, unspecified, third trimester: Secondary | ICD-10-CM

## 2016-05-21 DIAGNOSIS — R109 Unspecified abdominal pain: Secondary | ICD-10-CM | POA: Insufficient documentation

## 2016-05-21 DIAGNOSIS — O352XX0 Maternal care for (suspected) hereditary disease in fetus, not applicable or unspecified: Secondary | ICD-10-CM

## 2016-05-21 DIAGNOSIS — Z3A00 Weeks of gestation of pregnancy not specified: Secondary | ICD-10-CM | POA: Insufficient documentation

## 2016-05-21 DIAGNOSIS — O3433 Maternal care for cervical incompetence, third trimester: Secondary | ICD-10-CM

## 2016-05-21 DIAGNOSIS — O26899 Other specified pregnancy related conditions, unspecified trimester: Secondary | ICD-10-CM | POA: Insufficient documentation

## 2016-05-21 DIAGNOSIS — O34219 Maternal care for unspecified type scar from previous cesarean delivery: Secondary | ICD-10-CM

## 2016-05-21 NOTE — MAU Note (Signed)
Pain in lower abdomen X 2 days, now every 10-15 mins. Thinks it is contractions.No bleeding or leaking. States she has a cerclage. Previous C/S, but is planning a vaginal delivery.

## 2016-05-21 NOTE — MAU Note (Signed)
Note found that cerclage was removed 05/15/16

## 2016-05-22 ENCOUNTER — Encounter: Payer: Self-pay | Admitting: Obstetrics and Gynecology

## 2016-05-23 ENCOUNTER — Encounter: Payer: Self-pay | Admitting: Obstetrics and Gynecology

## 2016-05-23 NOTE — Progress Notes (Signed)
Patient did not keep OB appointment for 05/22/2016.  Cornelia Copaharlie Winry Egnew, Jr MD [facility]ending [facility](Faculty Practice)

## 2016-05-28 ENCOUNTER — Inpatient Hospital Stay (HOSPITAL_COMMUNITY)
Admission: AD | Admit: 2016-05-28 | Discharge: 2016-05-29 | Disposition: A | Discharge status: Home / Self Care | Payer: Self-pay | Source: Ambulatory Visit | Attending: Obstetrics and Gynecology | Admitting: Obstetrics and Gynecology

## 2016-05-28 ENCOUNTER — Encounter (HOSPITAL_COMMUNITY): Payer: Self-pay | Admitting: *Deleted

## 2016-05-28 DIAGNOSIS — Z3A00 Weeks of gestation of pregnancy not specified: Secondary | ICD-10-CM | POA: Insufficient documentation

## 2016-05-28 DIAGNOSIS — O3430 Maternal care for cervical incompetence, unspecified trimester: Secondary | ICD-10-CM

## 2016-05-28 DIAGNOSIS — R109 Unspecified abdominal pain: Secondary | ICD-10-CM | POA: Insufficient documentation

## 2016-05-28 DIAGNOSIS — O34219 Maternal care for unspecified type scar from previous cesarean delivery: Secondary | ICD-10-CM

## 2016-05-28 DIAGNOSIS — O26899 Other specified pregnancy related conditions, unspecified trimester: Secondary | ICD-10-CM

## 2016-05-28 DIAGNOSIS — O0990 Supervision of high risk pregnancy, unspecified, unspecified trimester: Secondary | ICD-10-CM

## 2016-05-28 LAB — POCT FERN TEST: POCT Fern Test: NEGATIVE

## 2016-05-28 NOTE — MAU Note (Signed)
Pt presents with complaint of bleeding and pain.

## 2016-05-29 ENCOUNTER — Inpatient Hospital Stay (HOSPITAL_COMMUNITY)
Admission: AD | Admit: 2016-05-29 | Discharge: 2016-06-01 | DRG: 775 | Disposition: A | Discharge status: Home / Self Care | Payer: Medicaid Other | Source: Ambulatory Visit | Attending: Obstetrics and Gynecology | Admitting: Obstetrics and Gynecology

## 2016-05-29 ENCOUNTER — Inpatient Hospital Stay (HOSPITAL_COMMUNITY): Payer: Medicaid Other | Admitting: Anesthesiology

## 2016-05-29 ENCOUNTER — Encounter (HOSPITAL_COMMUNITY): Payer: Self-pay

## 2016-05-29 DIAGNOSIS — O34211 Maternal care for low transverse scar from previous cesarean delivery: Secondary | ICD-10-CM | POA: Diagnosis present

## 2016-05-29 DIAGNOSIS — G5722 Lesion of femoral nerve, left lower limb: Secondary | ICD-10-CM | POA: Diagnosis present

## 2016-05-29 DIAGNOSIS — Z23 Encounter for immunization: Secondary | ICD-10-CM | POA: Diagnosis not present

## 2016-05-29 DIAGNOSIS — O3430 Maternal care for cervical incompetence, unspecified trimester: Secondary | ICD-10-CM

## 2016-05-29 DIAGNOSIS — G5712 Meralgia paresthetica, left lower limb: Secondary | ICD-10-CM | POA: Diagnosis not present

## 2016-05-29 DIAGNOSIS — Z3483 Encounter for supervision of other normal pregnancy, third trimester: Secondary | ICD-10-CM | POA: Diagnosis present

## 2016-05-29 DIAGNOSIS — Z833 Family history of diabetes mellitus: Secondary | ICD-10-CM

## 2016-05-29 DIAGNOSIS — Z3A39 39 weeks gestation of pregnancy: Secondary | ICD-10-CM

## 2016-05-29 DIAGNOSIS — O34219 Maternal care for unspecified type scar from previous cesarean delivery: Secondary | ICD-10-CM

## 2016-05-29 DIAGNOSIS — Z8249 Family history of ischemic heart disease and other diseases of the circulatory system: Secondary | ICD-10-CM

## 2016-05-29 DIAGNOSIS — O0990 Supervision of high risk pregnancy, unspecified, unspecified trimester: Secondary | ICD-10-CM

## 2016-05-29 LAB — CBC
HEMATOCRIT: 32.9 % — AB (ref 36.0–46.0)
HEMOGLOBIN: 10.7 g/dL — AB (ref 12.0–15.0)
MCH: 23.9 pg — ABNORMAL LOW (ref 26.0–34.0)
MCHC: 32.5 g/dL (ref 30.0–36.0)
MCV: 73.6 fL — ABNORMAL LOW (ref 78.0–100.0)
Platelets: 179 10*3/uL (ref 150–400)
RBC: 4.47 MIL/uL (ref 3.87–5.11)
RDW: 15.6 % — AB (ref 11.5–15.5)
WBC: 11.2 10*3/uL — AB (ref 4.0–10.5)

## 2016-05-29 LAB — GROUP B STREP BY PCR: GROUP B STREP BY PCR: POSITIVE — AB

## 2016-05-29 LAB — TYPE AND SCREEN
ABO/RH(D): A POS
ANTIBODY SCREEN: NEGATIVE

## 2016-05-29 LAB — OB RESULTS CONSOLE GBS: GBS: POSITIVE

## 2016-05-29 MED ORDER — OXYTOCIN 40 UNITS IN LACTATED RINGERS INFUSION - SIMPLE MED
INTRAVENOUS | Status: AC
  Filled 2016-05-29: qty 1000

## 2016-05-29 MED ORDER — LIDOCAINE HCL (PF) 1 % IJ SOLN
INTRAMUSCULAR | Status: AC
  Filled 2016-05-29: qty 30

## 2016-05-29 MED ORDER — CEFAZOLIN IN D5W 1 GM/50ML IV SOLN
1.0000 g | Freq: Three times a day (TID) | INTRAVENOUS | Status: DC
Start: 2016-05-30 — End: 2016-05-30
  Administered 2016-05-30: 1 g via INTRAVENOUS
  Filled 2016-05-29 (×2): qty 50

## 2016-05-29 MED ORDER — OXYCODONE-ACETAMINOPHEN 5-325 MG PO TABS: 1.0000 | ORAL_TABLET | ORAL | Status: DC | PRN

## 2016-05-29 MED ORDER — OXYTOCIN BOLUS FROM INFUSION: 500.0000 mL | Freq: Once | INTRAVENOUS | Status: DC

## 2016-05-29 MED ORDER — FENTANYL 2.5 MCG/ML BUPIVACAINE 1/10 % EPIDURAL INFUSION (WH - ANES)
INTRAMUSCULAR | Status: AC
  Filled 2016-05-29: qty 125

## 2016-05-29 MED ORDER — FLEET ENEMA 7-19 GM/118ML RE ENEM: 1.0000 | ENEMA | RECTAL | Status: DC | PRN

## 2016-05-29 MED ORDER — PHENYLEPHRINE 40 MCG/ML (10ML) SYRINGE FOR IV PUSH (FOR BLOOD PRESSURE SUPPORT)
PREFILLED_SYRINGE | INTRAVENOUS | Status: AC
  Filled 2016-05-29: qty 20

## 2016-05-29 MED ORDER — FENTANYL CITRATE (PF) 100 MCG/2ML IJ SOLN
100.0000 ug | Freq: Once | INTRAMUSCULAR | Status: AC
  Administered 2016-05-29: 100 ug via INTRAVENOUS
  Filled 2016-05-29: qty 2

## 2016-05-29 MED ORDER — OXYTOCIN 40 UNITS IN LACTATED RINGERS INFUSION - SIMPLE MED: 2.5000 [IU]/h | INTRAVENOUS | Status: DC

## 2016-05-29 MED ORDER — ONDANSETRON HCL 4 MG/2ML IJ SOLN: 4.0000 mg | Freq: Four times a day (QID) | INTRAMUSCULAR | Status: DC | PRN

## 2016-05-29 MED ORDER — SOD CITRATE-CITRIC ACID 500-334 MG/5ML PO SOLN
30.0000 mL | ORAL | Status: DC | PRN
Start: 2016-05-29 — End: 2016-05-30

## 2016-05-29 MED ORDER — TERBUTALINE SULFATE 1 MG/ML IJ SOLN
INTRAMUSCULAR | Status: AC
  Filled 2016-05-29: qty 1

## 2016-05-29 MED ORDER — LIDOCAINE HCL (PF) 1 % IJ SOLN
30.0000 mL | INTRAMUSCULAR | Status: DC | PRN
  Administered 2016-05-30: 30 mL via SUBCUTANEOUS
  Filled 2016-05-29: qty 30

## 2016-05-29 MED ORDER — OXYCODONE-ACETAMINOPHEN 5-325 MG PO TABS
2.0000 | ORAL_TABLET | ORAL | Status: DC | PRN
  Administered 2016-05-30: 2 via ORAL
  Filled 2016-05-29: qty 2

## 2016-05-29 MED ORDER — FENTANYL 2.5 MCG/ML BUPIVACAINE 1/10 % EPIDURAL INFUSION (WH - ANES)
INTRAMUSCULAR | Status: DC | PRN
  Administered 2016-05-29: 14 mL/h via EPIDURAL

## 2016-05-29 MED ORDER — ACETAMINOPHEN 325 MG PO TABS
650.0000 mg | ORAL_TABLET | ORAL | Status: DC | PRN
  Administered 2016-05-30: 650 mg via ORAL
  Administered 2016-05-30: 325 mg via ORAL
  Administered 2016-05-30: 650 mg via ORAL
  Filled 2016-05-29 (×3): qty 2

## 2016-05-29 MED ORDER — LACTATED RINGERS IV SOLN: 500.0000 mL | INTRAVENOUS | Status: DC | PRN

## 2016-05-29 MED ORDER — LIDOCAINE HCL (PF) 1 % IJ SOLN
INTRAMUSCULAR | Status: DC | PRN
  Administered 2016-05-29: 5 mL via EPIDURAL
  Administered 2016-05-29: 7 mL via EPIDURAL

## 2016-05-29 MED ORDER — CEFAZOLIN SODIUM-DEXTROSE 2-4 GM/100ML-% IV SOLN
2.0000 g | Freq: Once | INTRAVENOUS | Status: AC
  Administered 2016-05-29: 2 g via INTRAVENOUS
  Filled 2016-05-29: qty 100

## 2016-05-29 MED ORDER — LACTATED RINGERS IV SOLN
INTRAVENOUS | Status: DC
  Administered 2016-05-29 – 2016-05-30 (×3): via INTRAVENOUS

## 2016-05-29 NOTE — Anesthesia Preprocedure Evaluation (Signed)

## 2016-05-29 NOTE — Discharge Instructions (Signed)
Braxton Hicks Contractions °Contractions of the uterus can occur throughout pregnancy. Contractions are not always a sign that you are in labor.  °WHAT ARE BRAXTON HICKS CONTRACTIONS?  °Contractions that occur before labor are called Braxton Hicks contractions, or false labor. Toward the end of pregnancy (32-34 weeks), these contractions can develop more often and may become more forceful. This is not true labor because these contractions do not result in opening (dilatation) and thinning of the cervix. They are sometimes difficult to tell apart from true labor because these contractions can be forceful and people have different pain tolerances. You should not feel embarrassed if you go to the hospital with false labor. Sometimes, the only way to tell if you are in true labor is for your health care provider to look for changes in the cervix. °If there are no prenatal problems or other health problems associated with the pregnancy, it is completely safe to be sent home with false labor and await the onset of true labor. °HOW CAN YOU TELL THE DIFFERENCE BETWEEN TRUE AND FALSE LABOR? °False Labor °· The contractions of false labor are usually shorter and not as hard as those of true labor.   °· The contractions are usually irregular.   °· The contractions are often felt in the front of the lower abdomen and in the groin.   °· The contractions may go away when you walk around or change positions while lying down.   °· The contractions get weaker and are shorter lasting as time goes on.   °· The contractions do not usually become progressively stronger, regular, and closer together as with true labor.   °True Labor °· Contractions in true labor last 30-70 seconds, become very regular, usually become more intense, and increase in frequency.   °· The contractions do not go away with walking.   °· The discomfort is usually felt in the top of the uterus and spreads to the lower abdomen and low back.   °· True labor can be  determined by your health care provider with an exam. This will show that the cervix is dilating and getting thinner.   °WHAT TO REMEMBER °· Keep up with your usual exercises and follow other instructions given by your health care provider.   °· Take medicines as directed by your health care provider.   °· Keep your regular prenatal appointments.   °· Eat and drink lightly if you think you are going into labor.   °· If Braxton Hicks contractions are making you uncomfortable:   °¨ Change your position from lying down or resting to walking, or from walking to resting.   °¨ Sit and rest in a tub of warm water.   °¨ Drink 2-3 glasses of water. Dehydration may cause these contractions.   °¨ Do slow and deep breathing several times an hour.   °WHEN SHOULD I SEEK IMMEDIATE MEDICAL CARE? °Seek immediate medical care if: °· Your contractions become stronger, more regular, and closer together.   °· You have fluid leaking or gushing from your vagina.   °· You have a fever.   °· You pass blood-tinged mucus.   °· You have vaginal bleeding.   °· You have continuous abdominal pain.   °· You have low back pain that you never had before.   °· You feel your baby's head pushing down and causing pelvic pressure.   °· Your baby is not moving as much as it used to.   °  °This information is not intended to replace advice given to you by your health care provider. Make sure you discuss any questions you have with your health care   provider. °  °Document Released: 08/13/2005 Document Revised: 08/18/2013 Document Reviewed: 05/25/2013 °Elsevier Interactive Patient Education ©2016 Elsevier Inc. ° °

## 2016-05-29 NOTE — MAU Note (Signed)
Pt c/o contractions since yesterday that started getting stronger and more regular at 4pm. Pt denies leaking of fluid. Pt states baby is moving normally. Pt states she has had some spotting.

## 2016-05-29 NOTE — Anesthesia Procedure Notes (Signed)
Epidural Patient location during procedure: OB Start time: 05/29/2016 8:06 PM End time: 05/29/2016 8:10 PM  Staffing Anesthesiologist: Leilani AbleHATCHETT, Sandford Diop Performed: anesthesiologist   Preanesthetic Checklist Completed: patient identified, surgical consent, pre-op evaluation, timeout performed, IV checked, risks and benefits discussed and monitors and equipment checked  Epidural Patient position: sitting Prep: site prepped and draped and DuraPrep Patient monitoring: continuous pulse ox and blood pressure Approach: midline Location: L3-L4 Injection technique: LOR air  Needle:  Needle type: Tuohy  Needle gauge: 17 G Needle length: 9 cm and 9 Needle insertion depth: 6 cm Catheter type: closed end flexible Catheter size: 19 Gauge Catheter at skin depth: 11 cm Test dose: negative and Other  Assessment Sensory level: T9 Events: blood not aspirated, injection not painful, no injection resistance, negative IV test and no paresthesia  Additional Notes Reason for block:procedure for pain

## 2016-05-29 NOTE — H&P (Signed)
LABOR AND DELIVERY ADMISSION HISTORY AND PHYSICAL NOTE  Amy [name] is a 35 y.o. female [phone] with IUP at 158w1d by LMP and 1st trimester ultrasound presenting for SOL.   She is having consistent contractions q2-3 minutes and feels pressure in her abdomen. Does not feel constant pressure as if she has to push yet. Has a lot of pain in her back.   She reports positive fetal movement. She denies leakage of fluid or vaginal bleeding.  Prenatal History/Complications:  Past Medical History: Past Medical History:  Diagnosis Date  . Medical history non-contributory   . Preterm labor     Past Surgical History: Past Surgical History:  Procedure Laterality Date  . CERVICAL CERCLAGE N/A 01/13/2014   Procedure: CERCLAGE CERVICAL;  Surgeon: Adam PhenixJames G Arnold, MD;  Location: [facility];  Service: Gynecology;  Laterality: N/A;  . CERVICAL CERCLAGE N/A 12/22/2015   Procedure: CERCLAGE CERVICAL;  Surgeon: Willodean Rosenthalarolyn Harraway-Smith, MD;  Location: [facility];  Service: Gynecology;  Laterality: N/A;  . CESAREAN SECTION     one previous    Obstetrical History: OB History    Gravida Para Term Preterm AB Living   6 5 4 1  0 4   SAB TAB Ectopic Multiple Live Births   0 0 0 0 5      Social History: Social History   Social History  . Marital status: Single    Spouse name: N/A  . Number of children: N/A  . Years of education: N/A   Social History Main Topics  . Smoking status: Never Smoker  . Smokeless tobacco: Never Used  . Alcohol use No  . Drug use: No  . Sexual activity: Yes    Birth control/ protection: None   Other Topics Concern  . Not on file   Social History Narrative  . No narrative on file    Family History: Family History  Problem Relation Age of Onset  . Diabetes Father   . Diabetes Sister   . Hypertension Sister   . Heart disease Sister     has pacemaker  . Cancer Sister     Allergies: Allergies  Allergen Reactions  . Penicillins Rash    Has patient had  a PCN reaction causing immediate rash, facial/tongue/throat swelling, SOB or lightheadedness with hypotension: Yes Has patient had a PCN reaction causing severe rash involving mucus membranes or skin necrosis: Yes Has patient had a PCN reaction that required hospitalization No Has patient had a PCN reaction occurring within the last 10 years: No If all of the above answers are "NO", then may proceed with Cephalosporin use.     Prescriptions Prior to Admission  Medication Sig Dispense Refill Last Dose  . hydrocortisone cream 0.5 % Apply 1 application topically 2 (two) times daily. (Patient not taking: Reported on 05/15/2016) 30 g 0 Not Taking  . metroNIDAZOLE (FLAGYL) 500 MG tablet Take 1 tablet (500 mg total) by mouth 2 (two) times daily. (Patient not taking: Reported on 05/15/2016) 14 tablet 0 Not Taking  . Prenatal Vit-Fe Fumarate-FA (PRENATAL MULTIVITAMIN) TABS tablet Take 1 tablet by mouth daily at 12 noon.   05/27/2016 at Unknown time     Review of Systems   All systems reviewed and negative except as stated in HPI  Last menstrual period 08/29/2015, currently breastfeeding. General appearance: alert, cooperative and no distress Lungs: no respiratory distress Heart: regular rate Abdomen: soft, non-tender Extremities: No calf swelling or tenderness Presentation: cephalic Fetal monitoring: baseline 145, variable accelerations, no decelerations Uterine  activity: category 1 tracing Dilation: 5 Effacement (%): 70 Station: -2 Exam by:: [email or web address],[email or web address]   Prenatal labs: ABO, Rh: A/Positive/-- (04/20 0000) Antibody: Negative (04/20 0000) Rubella: immune RPR: NON REAC (08/01 1025)  HBsAg: Negative (04/20 0000)  HIV: NONREACTIVE (08/01 1025)  GBS:    Genetic screening:  Quad screen negative Anatomy US: normal at 19 weeks  Prenatal Transfer Tool  Maternal Diabetes: No Genetic Screening: Normal Maternal Ultrasounds/Referrals: Normal Fetal Ultrasounds or other Referrals:  Referred to  Materal Fetal Medicine  Maternal Substance Abuse:  No Significant Maternal Medications:  None Significant Maternal Lab Results: None  Results for orders placed or performed during the hospital encounter of 05/28/16 (from the past 24 hour(s))  Fern Test   Collection Time: 05/28/16 11:47 PM  Result Value Ref Range   POCT Fern Test Negative = intact amniotic membranes     Patient Active Problem List   Diagnosis Date Noted  . 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 Supervision of high risk pregnancy, antepartum 12/23/2015  . Cervical insufficiency during pregnancy, antepartum 12/22/2015  . Previous cesarean delivery, antepartum condition or complication 12/22/2015  . Tinea versicolor 12/30/2013    Assessment: Amy [name] is a 35 y.o. [id number] at [redacted]w[redacted]d here for SOL  #Labor: SOL, TOLAC #Pain: Epidural, s/p Fentanyl in MAU #FWB: Category 1 tracing #ID:  GBS status unknown, GBS pcr pending #MOF: breast/bottle #MOC:depo #Circ:  Yes, outpatient   Tillman Sers, DO PGY-1 10/3/20176:41 PM    OB FELLOW HISTORY AND PHYSICAL ATTESTATION  I have seen and examined this patient; I agree with above documentation in the resident's note.    Jen Mow, DO [place] Fellow 05/29/2016

## 2016-05-30 ENCOUNTER — Encounter (HOSPITAL_COMMUNITY): Payer: Self-pay

## 2016-05-30 DIAGNOSIS — Z3A39 39 weeks gestation of pregnancy: Secondary | ICD-10-CM

## 2016-05-30 LAB — RPR: RPR Ser Ql: NONREACTIVE

## 2016-05-30 MED ORDER — BENZOCAINE-MENTHOL 20-0.5 % EX AERO
1.0000 "application " | INHALATION_SPRAY | CUTANEOUS | Status: DC | PRN
  Administered 2016-05-30: 1 via TOPICAL
  Filled 2016-05-30: qty 56

## 2016-05-30 MED ORDER — SENNOSIDES-DOCUSATE SODIUM 8.6-50 MG PO TABS
2.0000 | ORAL_TABLET | ORAL | Status: DC
  Administered 2016-05-30 – 2016-05-31 (×2): 2 via ORAL
  Filled 2016-05-30 (×2): qty 2

## 2016-05-30 MED ORDER — ACETAMINOPHEN 325 MG PO TABS
650.0000 mg | ORAL_TABLET | ORAL | Status: DC | PRN
  Administered 2016-05-31: 650 mg via ORAL
  Filled 2016-05-30: qty 2

## 2016-05-30 MED ORDER — WITCH HAZEL-GLYCERIN EX PADS: 1.0000 "application " | MEDICATED_PAD | CUTANEOUS | Status: DC | PRN

## 2016-05-30 MED ORDER — EPHEDRINE 5 MG/ML INJ
10.0000 mg | INTRAVENOUS | Status: DC | PRN
  Filled 2016-05-30: qty 4

## 2016-05-30 MED ORDER — PHENYLEPHRINE 40 MCG/ML (10ML) SYRINGE FOR IV PUSH (FOR BLOOD PRESSURE SUPPORT)
80.0000 ug | PREFILLED_SYRINGE | INTRAVENOUS | Status: DC | PRN
  Filled 2016-05-30: qty 5

## 2016-05-30 MED ORDER — ONDANSETRON HCL 4 MG PO TABS: 4.0000 mg | ORAL_TABLET | ORAL | Status: DC | PRN

## 2016-05-30 MED ORDER — OXYTOCIN 40 UNITS IN LACTATED RINGERS INFUSION - SIMPLE MED
1.0000 m[IU]/min | INTRAVENOUS | Status: DC
  Administered 2016-05-30: 2 m[IU]/min via INTRAVENOUS

## 2016-05-30 MED ORDER — DIPHENHYDRAMINE HCL 25 MG PO CAPS: 25.0000 mg | ORAL_CAPSULE | Freq: Four times a day (QID) | ORAL | Status: DC | PRN

## 2016-05-30 MED ORDER — DIPHENHYDRAMINE HCL 50 MG/ML IJ SOLN: 12.5000 mg | INTRAMUSCULAR | Status: DC | PRN

## 2016-05-30 MED ORDER — TERBUTALINE SULFATE 1 MG/ML IJ SOLN: 0.2500 mg | Freq: Once | INTRAMUSCULAR | Status: DC | PRN

## 2016-05-30 MED ORDER — DIBUCAINE 1 % RE OINT: 1.0000 "application " | TOPICAL_OINTMENT | RECTAL | Status: DC | PRN

## 2016-05-30 MED ORDER — LACTATED RINGERS IV SOLN: 500.0000 mL | Freq: Once | INTRAVENOUS | Status: DC

## 2016-05-30 MED ORDER — IBUPROFEN 600 MG PO TABS
600.0000 mg | ORAL_TABLET | Freq: Four times a day (QID) | ORAL | Status: DC
  Administered 2016-05-30 – 2016-06-01 (×8): 600 mg via ORAL
  Filled 2016-05-30 (×8): qty 1

## 2016-05-30 MED ORDER — FENTANYL 2.5 MCG/ML BUPIVACAINE 1/10 % EPIDURAL INFUSION (WH - ANES)
INTRAMUSCULAR | Status: AC
  Filled 2016-05-30: qty 125

## 2016-05-30 MED ORDER — TETANUS-DIPHTH-ACELL PERTUSSIS 5-2.5-18.5 LF-MCG/0.5 IM SUSP
0.5000 mL | Freq: Once | INTRAMUSCULAR | Status: AC
  Administered 2016-05-31: 0.5 mL via INTRAMUSCULAR

## 2016-05-30 MED ORDER — ONDANSETRON HCL 4 MG/2ML IJ SOLN: 4.0000 mg | INTRAMUSCULAR | Status: DC | PRN

## 2016-05-30 MED ORDER — COCONUT OIL OIL
1.0000 "application " | TOPICAL_OIL | Status: DC | PRN
  Administered 2016-05-30: 1 via TOPICAL
  Filled 2016-05-30: qty 120

## 2016-05-30 MED ORDER — PRENATAL MULTIVITAMIN CH
1.0000 | ORAL_TABLET | Freq: Every day | ORAL | Status: DC
  Administered 2016-05-31 – 2016-06-01 (×2): 1 via ORAL
  Filled 2016-05-30 (×2): qty 1

## 2016-05-30 MED ORDER — INFLUENZA VAC SPLIT QUAD 0.5 ML IM SUSY
0.5000 mL | PREFILLED_SYRINGE | INTRAMUSCULAR | Status: AC
  Administered 2016-05-31: 0.5 mL via INTRAMUSCULAR

## 2016-05-30 MED ORDER — FENTANYL 2.5 MCG/ML BUPIVACAINE 1/10 % EPIDURAL INFUSION (WH - ANES): 14.0000 mL/h | INTRAMUSCULAR | Status: DC | PRN

## 2016-05-30 MED ORDER — ZOLPIDEM TARTRATE 5 MG PO TABS: 5.0000 mg | ORAL_TABLET | Freq: Every evening | ORAL | Status: DC | PRN

## 2016-05-30 MED ORDER — SIMETHICONE 80 MG PO CHEW: 80.0000 mg | CHEWABLE_TABLET | ORAL | Status: DC | PRN

## 2016-05-30 NOTE — Progress Notes (Signed)
Pt arrived via wheelchair to unit. Assessment completed. See flow sheet for details.   1135: pain reassessed. 4-5/10. Chux and undergarment changed.

## 2016-05-30 NOTE — Progress Notes (Signed)
Patient ID: Amy [name], female   DOB: November 24, 1980, 35 y.o.   MRN: [id number]  S: Patient seen & examined for progress of labor. Patient is comfortable with epidural. Patient initially came in spontaneous labor and SROM on own. Has since been completely dilated about 2 hours ago.   O:  Vitals:   05/30/16 0300 05/30/16 0330 05/30/16 0353 05/30/16 0401  BP: 122/66 118/60  121/64  Pulse: 99 (!) 108  99  Resp:      Temp:   99.7 F (37.6 C)   TempSrc:   Oral   SpO2:      Weight:      Height:       Dilation: 10 Dilation Complete Date: 05/30/16 Dilation Complete Time: 0444 Effacement (%): 100 Cervical Position: Anterior Station: 0 Presentation: Vertex Exam by:: Auryn Paige, MD  Attempt to assess position of baby, possibly OP but caput is limiting exam.  A/P: 35 y.o. [id number] 2875w2d with SOL.  Will start augmentation with pitocin and continue to labor down.   Cleda ClarksElizabeth W. Ladesha Pacini, DO  OB Fellow [facility]

## 2016-05-30 NOTE — Lactation Note (Signed)
This note was copied from a baby's chart. Lactation Consultation Note  Patient Name: Boy Vergie LivingCarmela Enriquez-Madariaga ZOXWR'UToday's Date: 05/30/2016 Reason for consult: Initial assessment Breastfeeding consultation services and support information given to patient in Spanish.  This is mom's fourth baby and she breastfed her previous babies.  Newborn is currently 4 hours old and showing some feeding cues.  Assisted with latching baby in side lying position.  Baby latched well and nursed with active suck/swallows.  Instructed mom to feed baby with any feeding cue and to call for assist/concerns prn.  Maternal Data Has patient been taught Hand Expression?: Yes Does the patient have breastfeeding experience prior to this delivery?: Yes  Feeding Feeding Type: Breast Fed Length of feed: 3 min  LATCH Score/Interventions Latch: Grasps breast easily, tongue down, lips flanged, rhythmical sucking.  Audible Swallowing: A few with stimulation Intervention(s): Skin to skin;Hand expression;Alternate breast massage  Type of Nipple: Everted at rest and after stimulation  Comfort (Breast/Nipple): Soft / non-tender     Hold (Positioning): Assistance needed to correctly position infant at breast and maintain latch. Intervention(s): Breastfeeding basics reviewed;Support Pillows;Position options;Skin to skin  LATCH Score: 8  Lactation Tools Discussed/Used     Consult Status Consult Status: Follow-up Date: 05/31/16 Follow-up type: In-patient    Huston FoleyMOULDEN, Mohamad Bruso S 05/30/2016, 11:55 AM

## 2016-05-31 DIAGNOSIS — Z3A39 39 weeks gestation of pregnancy: Secondary | ICD-10-CM

## 2016-05-31 DIAGNOSIS — G5712 Meralgia paresthetica, left lower limb: Secondary | ICD-10-CM | POA: Diagnosis not present

## 2016-05-31 MED ORDER — OXYCODONE-ACETAMINOPHEN 5-325 MG PO TABS: 2.0000 | ORAL_TABLET | ORAL | Status: DC | PRN

## 2016-05-31 MED ORDER — OXYCODONE-ACETAMINOPHEN 5-325 MG PO TABS
1.0000 | ORAL_TABLET | ORAL | Status: DC | PRN
  Administered 2016-05-31 – 2016-06-01 (×3): 1 via ORAL
  Filled 2016-05-31 (×3): qty 1

## 2016-05-31 NOTE — Progress Notes (Signed)
Complaining of left leg pain from groin to calf area and back of leg.  Pedal pulses present.  Bilateral edema noted of lower extremities.  Unable to ambulate alone, walker is being used.

## 2016-05-31 NOTE — Progress Notes (Signed)
Post Partum Day 1 Subjective: voiding, tolerating PO and C/O difficulty ambulating since delivery due to numbness in left thigh, pain in left anterior hip w/ ambulation. Able to bend knee and flex foot normally. Denies back pain. No Hx of similar Sx. Had epidural in labor. Reports Sx have improved since yesterday.   Objective: Blood pressure (!) 101/49, pulse 77, temperature 98 F (36.7 C), temperature source Oral, resp. rate 18, height 4' 11.5" (1.511 m), weight 188 lb (85.3 kg), last menstrual period 08/29/2015, SpO2 95 %, unknown if currently breastfeeding.  Physical Exam:  General: alert, cooperative, appears stated age, no distress and moderately obese Lochia: appropriate Uterine Fundus: firm Incision: NA DVT Evaluation: Negative Homan's sign. No cords or calf tenderness. No significant calf/ankle edema. Grossly normal sensation of entire leg. Difficulty raising leg, but able to flex and extend knee and ankle normally.     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

## 2016-05-31 NOTE — Anesthesia Postprocedure Evaluation (Signed)
Anesthesia Post Note  Patient: Amy [name]  Procedure(s) Performed: * No procedures listed *  Patient location during evaluation: [facility] Anesthesia Type: Epidural Level of consciousness: awake and alert Pain management: satisfactory to patient Vital Signs Assessment: post-procedure vital signs reviewed and stable Respiratory status: respiratory function stable Cardiovascular status: stable Postop Assessment: no headache, no backache, epidural receding, no signs of nausea or vomiting and adequate PO intake Anesthetic complications: no     Last Vitals:  Vitals:   05/30/16 2218 05/31/16 0528  BP: (!) 88/47 (!) 101/49  Pulse: 86 77  Resp: 18 18  Temp: 36.9 C 36.7 C    Last Pain:  Vitals:   05/31/16 0608  TempSrc:   PainSc: 5    Pain Goal: Patients Stated Pain Goal: 2 (05/31/16 [id number])               Karleen DolphinFUSSELL,[name]

## 2016-05-31 NOTE — Anesthesia Postprocedure Evaluation (Signed)
Anesthesia Post Note  Patient: Amy [name]  Procedure(s) Performed: * No procedures listed *  Patient location during evaluation: [facility] Anesthesia Type: Epidural Level of consciousness: awake Pain management: pain level controlled Vital Signs Assessment: post-procedure vital signs reviewed and stable Respiratory status: spontaneous breathing Cardiovascular status: stable Postop Assessment: no headache, no backache and epidural receding Anesthetic complications: no Comments: Pt had a difficult vaginal delivery with a prolonged second stage extreme hip flexion and shoulder dystocia. She was reassured that her L leg sensory deficits had nothing to do with her epidural and should resolve over the next 7-14 days.     Last Vitals:  Vitals:   05/30/16 2218 05/31/16 0528  BP: (!) 88/47 (!) 101/49  Pulse: 86 77  Resp: 18 18  Temp: 36.9 C 36.7 C    Last Pain:  Vitals:   05/31/16 0608  TempSrc:   PainSc: 5    Pain Goal: Patients Stated Pain Goal: 2 (05/31/16 16100608)               Tawna Alwin JR,JOHN Susann GivensFRANKLIN

## 2016-05-31 NOTE — Progress Notes (Signed)
Interpreter used to administer Influenza and Tdap vaccines. VIS given in Spanish.

## 2016-05-31 NOTE — Lactation Note (Signed)
This note was copied from a baby's chart. Lactation Consultation Note  Patient Name: Boy Vergie LivingCarmela Enriquez-Madariaga ZOXWR'UToday's Date: 05/31/2016 Reason for consult: Follow-up assessment  Baby 32 hours old. Assisted by in-house Spanish interpreter "Viria." Mom latching baby when this LC entered the room. Mom reports that baby is latching and nursing well. Enc mom to keep nursing with cues. Enc mom to call for assistance as needed.  Maternal Data    Feeding Feeding Type: Breast Fed Length of feed: 10 min  LATCH Score/Interventions Latch: Grasps breast easily, tongue down, lips flanged, rhythmical sucking.  Audible Swallowing: Spontaneous and intermittent  Type of Nipple: Everted at rest and after stimulation  Comfort (Breast/Nipple): Soft / non-tender     Hold (Positioning): No assistance needed to correctly position infant at breast.  LATCH Score: 10  Lactation Tools Discussed/Used     Consult Status Consult Status: PRN    Sherlyn HayJennifer D Kaydense Rizo 05/31/2016, 3:29 PM

## 2016-06-01 MED ORDER — IBUPROFEN 600 MG PO TABS: 600.0000 mg | ORAL_TABLET | Freq: Four times a day (QID) | ORAL | 0 refills | Status: DC | PRN

## 2016-06-01 MED ORDER — DOCUSATE SODIUM 100 MG PO CAPS: 100.0000 mg | ORAL_CAPSULE | Freq: Two times a day (BID) | ORAL | 2 refills | Status: DC | PRN

## 2016-06-01 NOTE — Discharge Instructions (Signed)
Parto vaginal, Cuidados posteriores  °(Vaginal Delivery, Care After) °Siga estas instrucciones durante las próximas semanas. Estas indicaciones para el alta le proporcionan información general acerca de cómo deberá cuidarse después del parto. El médico también podrá darle instrucciones específicas. El tratamiento ha sido planificado según las prácticas médicas actuales, pero en algunos casos pueden ocurrir problemas. Comuníquese con el médico si tiene algún problema o tiene preguntas al volver a su casa.  °INSTRUCCIONES PARA EL CUIDADO EN EL HOGAR  °· Tome sólo medicamentos de venta libre o recetados, según las indicaciones del médico o del farmacéutico. °· No beba alcohol, especialmente si está amamantando o toma analgésicos. °· No mastique tabaco ni fume. °· No consuma drogas. °· Continúe con un adecuado cuidado perineal. El buen cuidado perineal incluye: °¨ Higienizarse de adelante hacia atrás. °¨ Mantener la zona perineal limpia. °· No use tampones ni duchas vaginales hasta que su médico la autorice. °· Dúchese, lávese el cabello y tome baños de inmersión según las indicaciones de su médico. °· Utilice un sostén que le ajuste bien y que brinde buen soporte a sus mamas. °· Consuma alimentos saludables. °· Beba suficiente líquido para mantener la orina clara o de color amarillo pálido. °· Consuma alimentos ricos en fibra como cereales y panes integrales, arroz, frijoles y frutas y verduras frescas todos los días. Estos alimentos pueden ayudarla a prevenir o aliviar el estreñimiento. °· Siga las recomendaciones de su médico relacionadas con la reanudación de actividades como subir escaleras, conducir automóviles, levantar objetos, hacer ejercicios o viajar. °· Hable con su médico acerca de reanudar la actividad sexual. Volver a la actividad sexual depende del riesgo de infección, la velocidad de la curación y la comodidad y su deseo de reanudarla. °· Trate de que alguien la ayude con las actividades del hogar y con  el recién nacido al menos durante un par de días después de salir del hospital. °· Descanse todo lo que pueda. Trate de descansar o tomar una siesta mientras el bebé está durmiendo. °· Aumente sus actividades gradualmente. °· Cumpla con todas las visitas de control programadas para después del parto. Es muy importante asistir a todas las citas programadas de seguimiento. En estas citas, su médico va a controlarla para asegurarse de que esté sanando física y emocionalmente. °SOLICITE ATENCIÓN MÉDICA SI:  °· Elimina coágulos grandes por la vagina. Guarde algunos coágulos para mostrarle al médico. °· Tiene una secreción con feo olor que proviene de la vagina. °· Tiene dificultad para orinar. °· Orina con frecuencia. °· Siente dolor al orinar. °· Nota un cambio en sus movimientos intestinales. °· Aumenta el enrojecimiento, el dolor o la hinchazón en la zona de la incisión vaginal (episiotomía) o el desgarro vaginal. °· Tiene pus que drena por la episiotomía o el desgarro vaginal. °· La episiotomía o el desgarro vaginal se abren. °· Sus mamas le duelen, están duras o enrojecidas. °· Sufre un dolor intenso de cabeza. °· Tiene visión borrosa o ve manchas. °· Se siente triste o deprimida. °· Tiene pensamientos acerca de lastimarse o dañar al recién nacido. °· Tiene preguntas acerca de su cuidado personal, el cuidado del recién nacido o acerca de los medicamentos. °· Se siente mareada o sufre un desmayo. °· Tiene una erupción. °· Tiene náuseas o vómitos. °· Usted amamantó al bebé y no ha tenido su período menstrual dentro de las 12 semanas después de dejar de amamantar. °· No amamanta al bebé y no tuvo su período menstrual en las últimas 12° semanas después del   partoLance Muss.  Tiene fiebre. SOLICITE ATENCIN MDICA DE INMEDIATO SI:   Siente dolor persistente.  Siente dolor en el pecho.  Le falta el aire.  Se desmaya.  Siente dolor en la pierna.  Siente Physiological scientistdolor en el estmago.  El sangrado vaginal satura dos o ms  apsitos en 1 hora.   Esta informacin no tiene Theme park managercomo fin reemplazar el consejo del mdico. Asegrese de hacerle al mdico cualquier pregunta que tenga.   Document Released: 08/13/2005 Document Revised: 05/04/2015 Elsevier Interactive Patient Education 2016 ArvinMeritorElsevier Inc.   Desafos y soluciones para Mining engineerel amamantamiento (Breastfeeding Challenges and Solutions) Aunque el amamantamiento es un proceso natural, puede presentar desafos, en especial durante las primeras semanas despus del nacimiento del beb. Al comenzar a amamantar al nuevo beb, es normal que surjan problemas, incluso si ha Hewlett-Packardamamantado antes. En este documento, se indican algunas soluciones para los desafos ms frecuentes del amamantamiento.  DESAFOS Y SOLUCIONES Desafo: pezones irritados o agrietados Es frecuente que las madres que [address] Roadamamantan tengan los pezones irritados o Chief Strategy Officeragrietados. Los pezones agrietados o irritados a menudo se deben a que la boca del beb se prende de forma inadecuada para Museum/gallery exhibitions officeramamantar. Tambin puede haber irritacin si el beb no est ubicado como corresponde [employer] [employer]iden la mama. Si bien es frecuente DIRECTVtener los pezones irritados y agrietados durante la primera semana posterior al nacimiento, el dolor en los pezones no es normal nunca. Si tiene los pezones irritados o agrietados durante ms de Kayak Point1semana, o si tiene Federated Department Storesdolor en los pezones, llame a su mdico o al Holiday representativeasesor en lactancia.  Solucin Siga los siguientes pasos para asegurarse de que el beb se prenda y est ubicado como corresponde:  Encuentre un lugar cmodo para sentarse o Teacher, musicacostarse, con un buen respaldo para el cuello y la espalda.  Coloque una almohada o una manta enrollada debajo del beb para acomodarlo a la altura de la mama (si est sentada).  Asegrese de que el abdomen del beb est frente al suyo.  Masajee suavemente la mama. Con las yemas de los dedos, masajee la pared del pecho hacia el pezn en un movimiento circular. Esto estimula el flujo de [place] Parkleche. Es  posible que Engineer, manufacturing systemsdeba continuar este movimiento mientras amamanta si la leche fluye lentamente.  Sostenga la mama con el pulgar por arriba del pezn y los otros 4 dedos por debajo de la mama. Asegrese de que los dedos se encuentren lejos del pezn y de la boca del beb.  Empuje suavemente los labios del beb con el pezn o con el dedo.  Cuando la boca del beb se abra lo suficiente, acrquelo rpidamente a la mama e introduzca todo el pezn y la zona oscura que lo rodea (areola), tanto como sea posible, dentro de la boca del beb.  Debe haber ms areola visible por arriba del labio superior del beb que por debajo del labio inferior.  La lengua del beb debe estar entre la enca inferior y la Mustang Ridgemama.  Asegrese de que la boca del beb est en la posicin correcta alrededor del pezn (prendida). Los labios del beb deben crear un sello sobre la mama, doblndose hacia afuera (invertidos).  Es comn que el beb succione durante 2 a 3 minutos para que comience el flujo de Roxanaleche materna. Entre los signos de que el beb se ha prendido Audiological scientistadecuadamente al pezn, se incluyen:   El beb tironea o succiona con tranquilidad, sin causarle dolor.  Se escucha que traga cada 3 o 4 succiones.  Hace movimientos musculares por Tomasita Crumblearriba  y por delante de sus odos al Printmaker. Entre los signos de que el beb no se ha prendido Audiological scientist al pezn, se incluyen:   Ruidos de succin o de chasquido Patent attorney.  Dolor en el pezn. Para asegurarse de Tesoro Corporation y sanos, haga lo siguiente:  No se lave los pezones con [place].  Use un sostn de soporte. Evite usar sostenes con aro o sostenes ajustados.  Deje secar al aire los pezones durante 3 a despus de amamantar al beb.  Utilice solamente almohadillas de algodn en el sostn para absorber las prdidas de [place]. La prdida de un poco de Deere & Company las tomas es normal. Asegrese de Multimedia programmer las almohadillas si se empapan  con [place].  Colquese lanolina United Stationers pezones despus de Museum/gallery exhibitions officer. La lanolina ayuda a mantener la humedad normal de la piel. Si [place] lanolina pura, no tiene que lavarse los pezones antes de Corporate treasurer al beb. La lanolina pura no es txica para el beb. Tambin puede extraer Teachers Insurance and Annuity Association gotas de [place], [place] con suavidad sobre los pezones y dejar que se seque al aire. Desafo: congestin mamaria La Gap Inc se produce cuando las [address] se llenan excesivamente de [place]. Las primeras semanas despus del nacimiento, usted puede tener congestin [place]. La congestin International Business Machines mamas latan y estn duras, muy tirantes, calientes y sensibles. La congestin llega a un pico mximo alrededor del quinto da despus del nacimiento. El hecho de tener congestin mamaria no significa que deba dejar de Museum/gallery exhibitions officer al beb. Solucin  Alimente al beb cuando muestre signos de hambre o si siente la necesidad de reducir la congestin de las [place]. Esto se denomina "lactancia a demanda".  Los recin nacidos (bebs de menos de 4semanas de vida) a menudo se alimentan cada 1 a 3 horas Administrator. Es posible que deba despertar al beb si est durmiendo cuando es hora de [place].  No deje que el beb duerma ms de 5horas durante la noche sin amamantarlo.  Extraiga leche de forma manual o con un sacaleche antes de amamantar para ablandar la mama, la areola y el pezn.  Aplique calor hmedo (en la ducha o con toallas de mano humedecidas con agua tibia) justo antes de amamantar o de extraer [place], o masajee la mama antes de amamantar o mientras lo hace. Esto aumenta la circulacin y [place] a que la [place].  Vace por completo las mamas al QUALCOMM o extraer [place]. Despus, use un sostn tipo deportivo (para amamantar o comn) o una camiseta sin mangas durante 1 o 2 das para indicarle al cuerpo que disminuya ligeramente la produccin de [place]. Use solamente un  sostn tipo deportivo o una camiseta sin mangas para tratar la congestin. Las [employer].C. Holdings que amamantan habitualmente deben evitar los sostenes ajustados. Una vez que la congestin se [place], vuelva a usar ropa [place], [place].  Aplquese compresas de hielo en las mamas para reducir Chief Technology Officer de la congestin y Technical sales engineer hinchazn, excepto si el hielo le resulta incmodo.  No retrase los horarios de amamantamiento. Intente relajarse cuando sea la hora de alimentar al beb. Esto ayuda a Licensed conveyancer "reflejo de bajada", que hace que la [place] comience a salir de Educational psychologist.  Asegrese de que el beb est bien prendido a la mama y que se encuentre en la posicin correcta mientras lo [place].  Deje que el beb permanezca en la mama todo el tiempo que est prendido bien y que  succione activamente. El beb le har saber que ha terminado de alimentarse cuando se desprenda de la mama o se quede dormido.  Evite darle biberones o chupetes al beb en las primeras semanas de amamantamiento. Para introducir estos elementos, espere hasta despus de resolver cualquier desafo con el amamantamiento.  Si regresa al Aleen Campitrabajo o est fuera de su casa durante un perodo prolongado, trate de Marine scientistextraerse leche en el mismo horario que debera amamantar al beb.  Tome mucho lquido para Statisticianevitar la deshidratacin, que con el tiempo puede aumentar el riesgo de Webstercongestin mamaria. Si sigue estas indicaciones, la congestin debe mejorar en 24 a 48 horas. Si an tiene dificultades, llame a su mdico o al Holiday representativeasesor en lactancia.  Desafo: conductos galactforos obstruidos Los conductos galactforos se obstruyen cuando no drenan la leche como corresponde y, en consecuencia, se hinchan. El uso de un sostn para Museum/gallery exhibitions officeramamantar ajustado o las dificultades para que el beb se prenda pueden causar la obstruccin de estos conductos. El hecho de no tomar la cantidad suficiente de agua (de 8 a 10vasos [1,9 a 2,4l] por Futures traderda) puede contribuir a que los conductos  galactforos se obstruyan. Una vez que un conducto se obstruye, puede tener bultos duros, irritacin y Copyenrojecimiento en la mama.  Solucin No retrase los horarios de Librarian, academicamamantamiento. Alimente a su beb con frecuencia y trate de vaciar sus pechos cada vez que lo amamanta. Trate de alimentarlo primero del lado afectado ya que habr ms probabilidad de que se vace por completo ese pecho. Aplique toallas hmedas calientes durante 5 a 10minutos antes de amamantar. De forma alternativa, una ducha caliente justo antes de Museum/gallery exhibitions officeramamantar puede ofrecer el calor hmedo que estimula el flujo de San Carlos IIleche. Tambin puede ser de Mohawk Industriesayuda masajear suavemente la zona irritada antes de Museum/gallery exhibitions officeramamantar y Candlewood Shoresmientras est Fullertonamamantando. Evite usar ropa Indonesiaajustada o sostenes que presionen las Midtownmamas. Use sostenes que sostengan bien las Upper W[place] Gulchmamas, pero evite usar sostenes con arco. Si se le obstruye un conducto galactfero y tiene Poso Parkfiebre, debe ver a su mdico.  Desafo: mastitis La mastitis es la inflamacin de la mama. Por lo general, est causada por una infeccin bacteriana y puede provocar sntomas similares a los de Emergency planning/management officerla gripe. Puede tener enrojecimiento de la mama y Pickrellfiebre. A menudo, cuando hay una mastitis, la mama est dura y caliente, y duele mucho. Las causas ms frecuentes de la mastitis son la mala prendida o la succin inadecuada del beb, presin constante en la mama (posiblemente por usar un sostn ajustado o una camisa que limite el flujo de Herndonleche), estrs o fatiga anormales, o no Museum/gallery exhibitions officeramamantar con regularidad.  Solucin Le recetarn un antibitico para tratar la infeccin. Sigue siendo importante que amamante con frecuencia para vaciar las McFarlandmamas. Seguir Qwest Communicationsamamantando mientras se recupera de la mastitis no le har dao al beb. Asegrese de que el beb est bien ubicado cada vez que lo Upper Exeteramamanta. Aplique calor hmedo en las mamas durante unos minutos antes de amamantar para ayudar al flujo de la East Columbialeche y a que las mamas se vacen ms  fcilmente. Desafo: candidiasis La candidiasis es una infeccin por hongos que se forma en los pezones, en la mama o en la boca del beb. Causa picazn, dolor, ardor o dolor punzante, y a veces una erupcin.  Solucin Le recetarn un ungento para los Circle D-KC Estatespezones, y su beb recibir un medicamento lquido para Government social research officerla boca. Es importante que usted y el beb reciban tratamiento de forma simultnea, porque pueden transmitirse la candidiasis entre ustedes. Cambie los apsitos  mamarios desechables con frecuencia. Cada da, debe lavar con agua muy caliente cualquier sostn, toalla o ropa que entre en contacto con las reas infectadas de su cuerpo o del cuerpo del beb. Lvese las manos y lave las manos del beb con frecuencia. Hierva durante 20 minutos, una vez al da, todos los chupetes, las tetinas de los biberones y los juguetes que el beb se lleve a la boca. Despus de 1 semana de tratamiento, deseche los chupetes y las tetinas de los biberones y compre unos nuevos. Debe hervir durante 20 minutos, todos los McVeytowndas, todos los componentes del sacaleche que estn en contacto con la Inmanleche. Desafo: poca produccin de Clorox Companyleche Si el beb no aumenta de peso como corresponde, es posible que usted no est produciendo US Airwayssuficiente leche. La produccin de Colgate Palmoliveleche materna est basada en un sistema de oferta y Amazoniademanda. La produccin de WPS Resourcesleche depende de la frecuencia y la eficacia con la que el beb vaca la mama.  Solucin Cuanto ms amamante y se extraiga Bunker Hillleche, ms Comcastleche producir. Es importante que el beb vace al menos una de las mamas en cada alimentacin. Si no es as, use un sacaleche o extraiga la leche restante de forma manual. Esto ayudar a drenar la mayor cantidad de Raymondvilleleche posible cada vez que amamanta. Tambin ser Merrill Lynchuna seal para que el cuerpo produzca ms Wainwrightleche. Si el beb no le est vaciando las [address]amas, esto puede deberse a problemas de prendida, succin o ubicacin. Si sigue teniendo poca leche despus de Health Netresolver estos  problemas, comunquese con su mdico o con un especialista en lactancia lo antes posible. Desafo: pezones invertidos o planos En algunas mujeres, los pezones estn metidos hacia adentro en vez de sobresalir. Otras mujeres tiene pezones planos. Los pezones invertidos o planos a veces pueden hacer que el beb tenga ms dificultades para prenderse a la mama. Solucin Le darn un pequeo dispositivo que saca hacia afuera los pezones invertidos. Este dispositivo debe colocarse inmediatamente antes de poner al beb en la mama. Tambin puede tratar de usar Oceanographerel sacaleche durante un corto tiempo antes de colocar al beb en la mama. El sacaleche puede sacar el pezn hacia afuera para ayudar a su beb a que se agarre con mayor facilidad. Tambin la succin del beb ayudar a que el pezn invertido sobresalga.  Si tiene los pezones planos, estimule al beb para que se prenda a la mama y Technical sales engineeramamante con frecuencia los primeros das despus del nacimiento. Esto le dar la prctica para que se prenda correctamente mientras la mama est blanda. Cuando la produccin de leche aumente entre el segundo y el quinto da despus del parto y las mamas se llenen, el beb se prender ms fcilmente.  Si sigue teniendo inquietudes, comunquese con Press photographerun asesor en lactancia, quien podr ensearle tcnicas adicionales para resolver los problemas de amamantamiento relacionados con la posicin y la forma del pezn.  PARA OBTENER MS INFORMACIN Liga internacional La Leche: [email or web address].   Esta informacin no tiene como fin reemplazar el consejo del mdico. Asegrese de hacerle al mdico cualquier pregunta que tenga.   Document Released: 01/30/2008 Document Revised: 09/03/2014 Elsevier Interactive Patient Education Yahoo[employer] Inc2016 Elsevier Inc.

## 2016-06-01 NOTE — Discharge Summary (Signed)
OB Discharge Summary     Patient Name: Amy [name] DOB: August 05, 1981 MRN: [id number]  Date of admission: 05/29/2016 Delivering MD: Jen MowMUMAW, [facility]   Date of discharge: 06/01/2016  Admitting diagnosis: LABOR Intrauterine pregnancy: 7747w2d     Secondary diagnosis:  Active Problems:   Lateral femoral cutaneous neuropathy, left  Additional problems: Shoulder Dystocia, 3rd degree laceration     Discharge diagnosis: Term Pregnancy Delivered                                                                                                Post partum procedures:None  Augmentation: Pitocin  Complications: Shoulder Dystocia, Lateral femoral cutaneous neuropathy, left  Hospital course:  Onset of Labor With Vaginal Delivery     35 y.o. yo [id number] at 1347w2d was admitted in Active Labor on 05/29/2016. Patient had an uncomplicated labor course as follows:  Membrane Rupture Time/Date: 7:35 PM ,05/29/2016   Intrapartum Procedures: Episiotomy: None [1]                                         Lacerations:  3rd degree [4];Perineal [11]  Patient had a delivery of a Viable infant. 05/30/2016  Information for the patient's newborn:  Judithann Sheennriquez-Madariaga, Boy Kaisa [[id number]][[id number]]  Delivery Method: Vag-Spont    Pateint had an uncomplicated postpartum course.  She is ambulating, tolerating a regular diet, passing flatus, and urinating well. Patient is discharged home in stable condition on 06/01/16.    Physical exam Vitals:   05/31/16 0528 05/31/16 1200 05/31/16 1759 06/01/16 0601  BP: (!) 101/49 102/61 (!) 94/59 (!) 104/50  Pulse: 77 86 68 71  Resp: 18 18 18 18   Temp: 98 F (36.7 C) 97.9 F (36.6 C) 98.1 F (36.7 C) 97.8 F (36.6 C)  TempSrc: Oral Oral Oral   SpO2: 95% 97% 99%   Weight:      Height:       General: alert, cooperative and no distress Lochia: appropriate Uterine Fundus: firm Incision: N/A DVT Evaluation: Negative Homan's sign. Extremities: Able to bear  weight and lift leg much better today. Ambulating w/out walker.   Labs: Lab Results  Component Value Date   WBC 11.2 (H) 05/29/2016   HGB 10.7 (L) 05/29/2016   HCT 32.9 (L) 05/29/2016   MCV 73.6 (L) 05/29/2016   PLT 179 05/29/2016   CMP Latest Ref Rng & Units 04/04/2016  Glucose 65 - 104 mg/dL 83  BUN 6 - 23 mg/dL -  Creatinine 2.530.50 - 6.641.10 mg/dL -  Sodium 403135 - 474145 mEq/L -  Potassium 3.5 - 5.1 mEq/L -  Chloride 96 - 112 mEq/L -  CO2 19 - 32 meq/L -  Calcium 8.4 - 10.5 mg/dL -  Total Protein 6.0 - 8.3 g/dL -  Total Bilirubin 0.3 - 1.2 mg/dL -  Alkaline Phos 39 - 259117 units/L -  AST 0 - 37 units/L -  ALT 0 - 35 units/L -    Discharge instruction: per After Visit Summary and "Baby  and Me Booklet".  After visit meds:    Medication List    STOP taking these medications   acetaminophen 325 MG tablet Commonly known as:  TYLENOL     TAKE these medications   docusate sodium 100 MG capsule Commonly known as:  COLACE Take 1 capsule (100 mg total) by mouth 2 (two) times daily as needed for mild constipation.   ibuprofen 600 MG tablet Commonly known as:  ADVIL,MOTRIN Take 1 tablet (600 mg total) by mouth every 6 (six) hours as needed for moderate pain.   prenatal multivitamin Tabs tablet Take 1 tablet by mouth daily at 12 noon.       Diet: routine diet  Activity: Advance as tolerated. Pelvic rest for 6 weeks.   Outpatient follow up:2 weeks  To reeval lateral femoral cutaneous neuropathy Follow up Appt:Future Appointments Date Time Provider Department Center  07/10/2016 10:20 AM Donette Larry, CNM [facility]   Follow up Visit: Follow-up Information    [facility] 2 week(s).   Specialty:  Obstetrics and Gynecology Why:  to reevaluate difficulties walking after birth. Schedule w/ Dr. Adrian Blackwater if possible.  Needs 6 week postpartum visit. Contact information: [address] [place] [address] [phone]        [facility] [place] MATERNITY ADMISSIONS .   Why:  as needed in emergencies Contact information: [address] [id number] mc [place] [address] [phone]           Postpartum contraception: Depo Provera. No sex until PP visit.   Newborn Data: Live born female  Birth Weight: 8 lb 6.4 oz (3810 g) APGAR: 5, 8  Baby Feeding: Breast Disposition:home with mother   06/01/2016 Dorathy Kinsman, CNM

## 2016-06-01 NOTE — Progress Notes (Signed)
Discharge instructions given, questions answered, states understanding, pt signs and given copy 

## 2016-06-01 NOTE — Lactation Note (Signed)
This note was copied from a baby's chart. Lactation Consultation Note: Mother states that infant is feed well. Mother denies having pain or discomfort on breast or nipples. Advised mother to cont to feed infant 8-12 times.   Patient Name: Amy Vergie LivingCarmela [name] ZOXWR'UToday's Date: 06/01/2016     Maternal Data    Feeding Feeding Type: Breast Fed Length of feed: 15 min  LATCH Score/Interventions                      Lactation Tools Discussed/Used     Consult Status      Amy BickersKendrick, Amy [name] 06/01/2016, 1:13 PM

## 2016-06-02 ENCOUNTER — Ambulatory Visit: Payer: Self-pay

## 2016-06-02 NOTE — Lactation Note (Signed)
This note was copied from a baby's chart. Lactation Consultation Note  Patient Name: Amy [name] OTRRN'H Date: 06/02/2016    Spanish Interpreter present. Baby 41 hours old & in NICU.  Mother will be discharged. Provided her with [facility] loaner pump, labels, bottles and NICU booklet. Recommend she pump every 3 hours when away from her baby with the exception of once during the night. Discussed milk storage and milk transportation. Mother has been using DEBP in NICU and has kit. Denies questions or problems with breastfeeding.    Maternal Data    Feeding    LATCH Score/Interventions                      Lactation Tools Discussed/Used     Consult Status      Carlye Grippe 06/02/2016, 9:17 AM

## 2016-06-04 ENCOUNTER — Ambulatory Visit: Payer: Self-pay

## 2016-06-04 NOTE — Lactation Note (Signed)
This note was copied from a baby's chart. Lactation Consultation Note  Patient Name: Amy [name] ZOXWR'UToday's Date: 06/04/2016 Reason for consult: Follow-up assessment;NICU baby   Follow up with mom of 5 do NICU infant who is being d/c home today. Spoke with mom Amy [name] # [phone]. Hospital interpreter not available at this time.   Mom reports she is breast and bottle feeding infant. She reports she is trying to place him to breast as much as possible, she reports when she is full the infant wont always latch. Enc her to prepump to soften areola as needed to get infant to latch. She reports she plans to continue to breast and bottle feed formula. She is not pumping much in the last few days since infant is breast feeding. Mom is concerned she does not have enough milk.   Mom asked how she can increase her supply. Enc her to BF infant as much as possible and to pump post BF and use her milk to supplement infant.Enc her not to stop all supplement at one time and to pump and use her milk to supplement as much as possible. Enc her to offer breast prior to supplement with each feeding Gave her handout on Fenugreek but informed her that without increased feeding and or pumping her milk will not increase. Discussed supply and demand with mom also. Discussed with mom that a BF infant tends to feed more often than a formula fed infant and that as long as the infant is voiding and stooling that is ok and normal.   Mom plans to call and make a WIC appt. Mom without further questions at this time. Enc her to call with questions/concerns prn.    Maternal Data Formula Feeding for Exclusion: No  Feeding Feeding Type: Bottle Fed - Formula Nipple Type: Regular Length of feed: 40 min  LATCH Score/Interventions Latch: Grasps breast easily, tongue down, lips flanged, rhythmical sucking.  Audible Swallowing: Spontaneous and intermittent  Type of Nipple: Everted at rest and  after stimulation  Comfort (Breast/Nipple): Soft / non-tender     Hold (Positioning): No assistance needed to correctly position infant at breast.  LATCH Score: 10  [facility]act[facility] Pump Review: Setup, frequency, and cleaning;Milk Storage   Consult Status Consult Status: Complete Follow-up type: Call as needed    Ed BlalockSharon S Vlada Uriostegui 06/04/2016, 12:23 PM

## 2016-06-07 ENCOUNTER — Encounter: Payer: Self-pay | Admitting: Obstetrics and Gynecology

## 2016-07-10 ENCOUNTER — Encounter: Payer: Self-pay | Admitting: Certified Nurse Midwife

## 2016-07-10 ENCOUNTER — Ambulatory Visit (INDEPENDENT_AMBULATORY_CARE_PROVIDER_SITE_OTHER): Payer: Self-pay | Admitting: Certified Nurse Midwife

## 2016-07-10 NOTE — Progress Notes (Signed)
Video Interepreter # [id number] Pt reports having a nipple coming inward on the left breast.  Pt noticed it after delivery.

## 2016-07-10 NOTE — Progress Notes (Signed)
Subjective:     Amy [name] is a 35 y.o. female who presents for a postpartum visit. She is 6 weeks postpartum following a spontaneous vaginal delivery. I have fully reviewed the prenatal and intrapartum course. The delivery was at 39 gestational weeks. Outcome: spontaneous vaginal deliverywith shoulder dystocia and 3rd degree perineal laceration. Anesthesia: epidural. Postpartum course has been uncomplicated. Baby's course has been uncomplicated. Baby is feeding by bottle - Similac Advance and breast. Bleeding no bleeding. Bowel function is normal. Bladder function is normal. Patient is not sexually active. Contraception method is Depo-Provera injections. Postpartum depression screening: negative. She is concerned that left nipple became inverted after she started pumping the breast about 1 week after birth. Denies any mass.  The following portions of the patient's history were reviewed and updated as appropriate: allergies, current medications, past family history, past medical history, past social history, past surgical history and problem list.  Review of Systems Pertinent items are noted in HPI.   Objective:    BP 106/65   Pulse 67   Wt 165 lb 14.4 oz (75.3 kg)   BMI 32.95 kg/m   General:  alert, cooperative and no distress   Breasts:  inspection negative, no nipple discharge or bleeding, no masses or nodularity palpable, bilateral nipples nml and erect-no inversion seen with either  Lungs: normal effort and rate  Heart:  normal rate  Abdomen: soft, non-tender; bowel sounds normal; no masses,  no organomegaly   Vulva:  normal  Vagina: normal vagina, no discharge, exudate, lesion, or erythema and laceration approximated and well healed  Cervix:  anteverted  Corpus: normal size, contour, position, consistency, mobility, non-tender  Adnexa:  normal adnexa  Rectal Exam: Not performed.        Assessment:     Normal postpartum exam. Pap smear not done at today's visit.   Breast concern-no evidence of mass or abnormality, ok to resume lactation Plan:    1. Contraception: Depo-Provera injections- [facility]  2. Follow up as needed.

## 2017-07-10 ENCOUNTER — Encounter (HOSPITAL_COMMUNITY): Payer: Self-pay

## 2017-09-19 IMAGING — US US MFM OB TRANSVAGINAL
1 series · 13 of 28 positions shown · non-contrast
Comparison: none

[Series 1: us mfm ob transvaginal · 71 acquisitions, 13 frames shown]
[im 3/71]
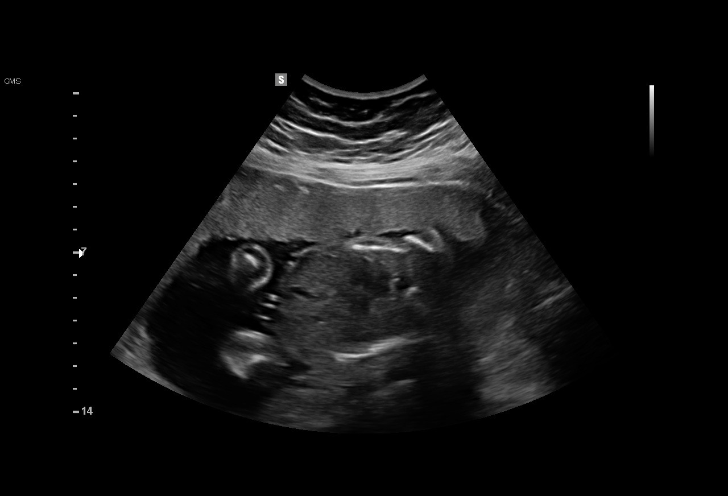
[im 8/71]
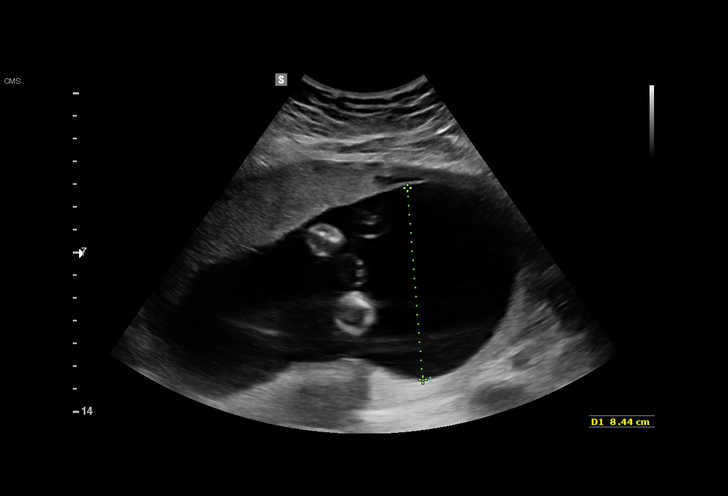
[im 13/71]
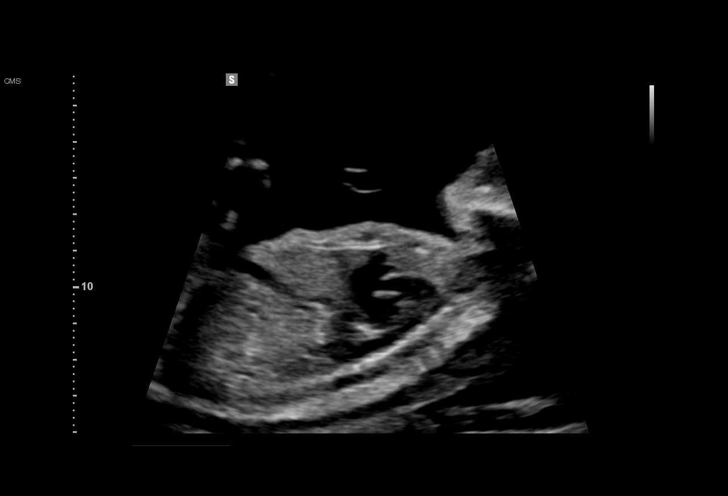
[im 19/71]
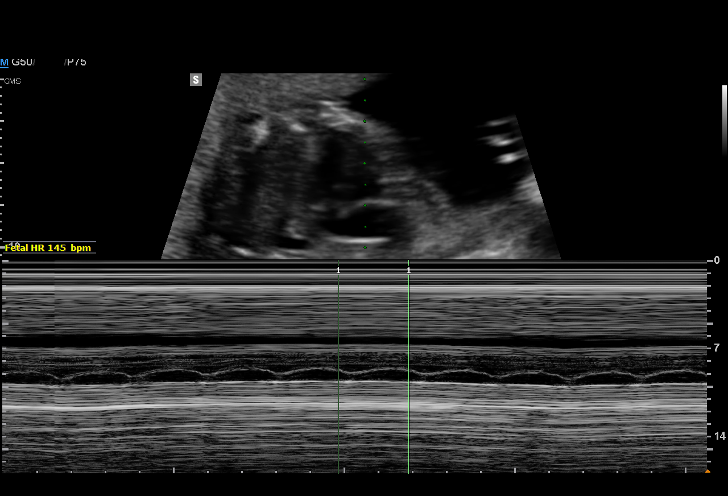
[im 24/71]
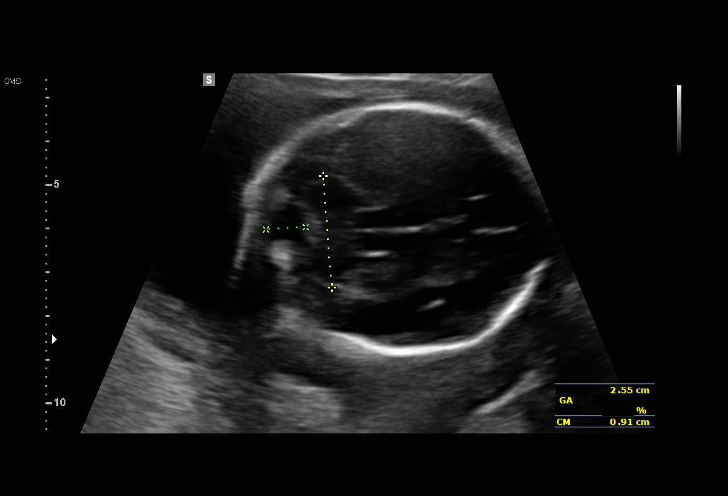
[im 29/71]
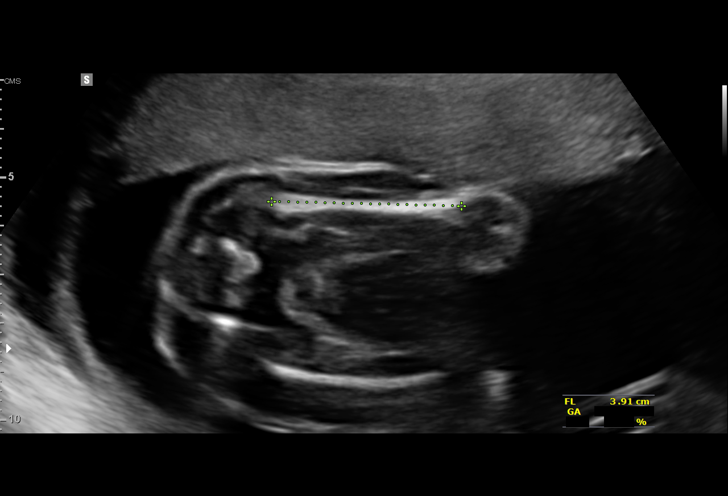
[im 37/71]
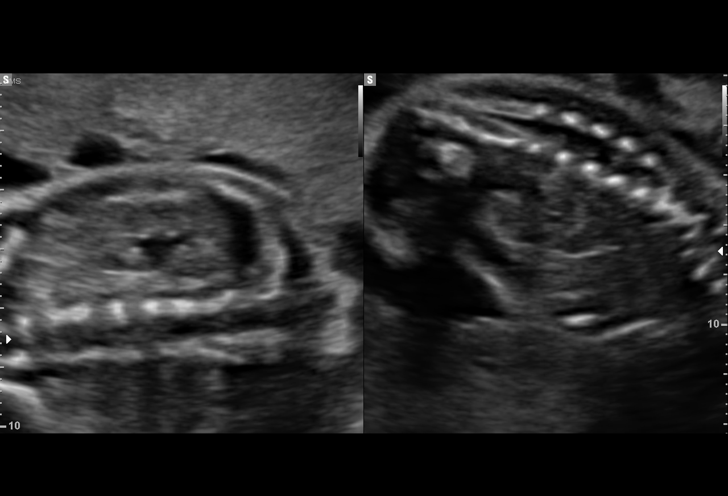
[im 42/71]
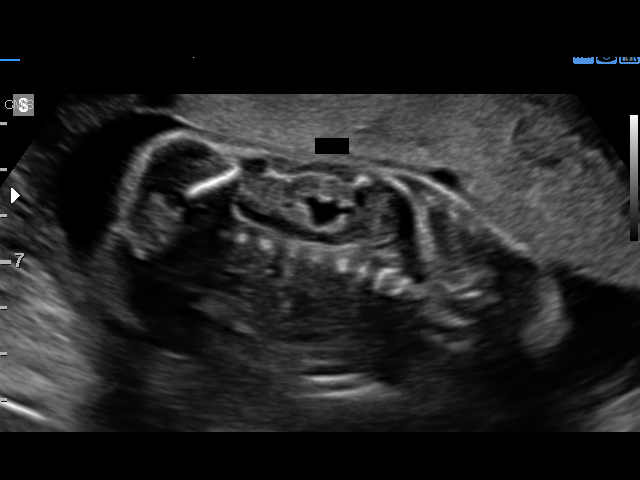
[im 47/71]
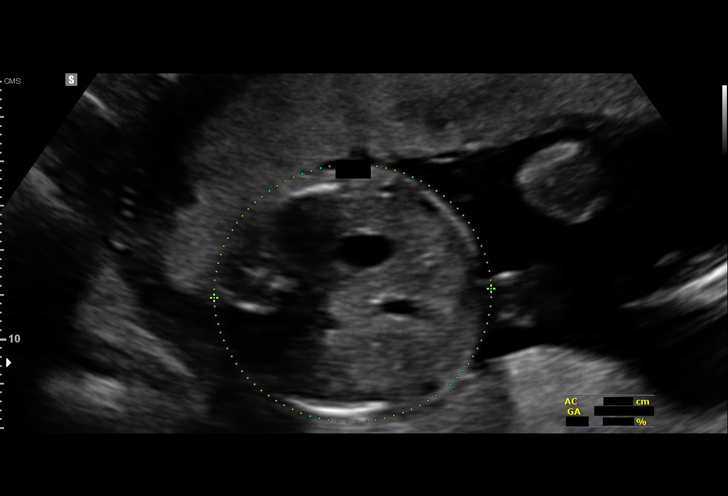
[im 52/71]
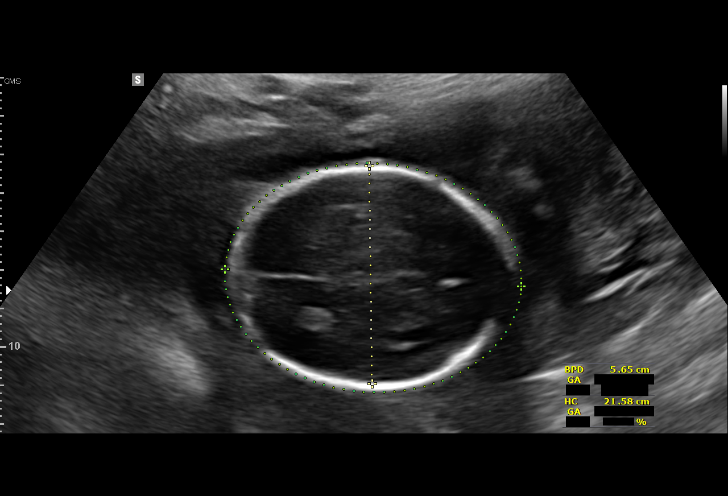
[im 58/71]
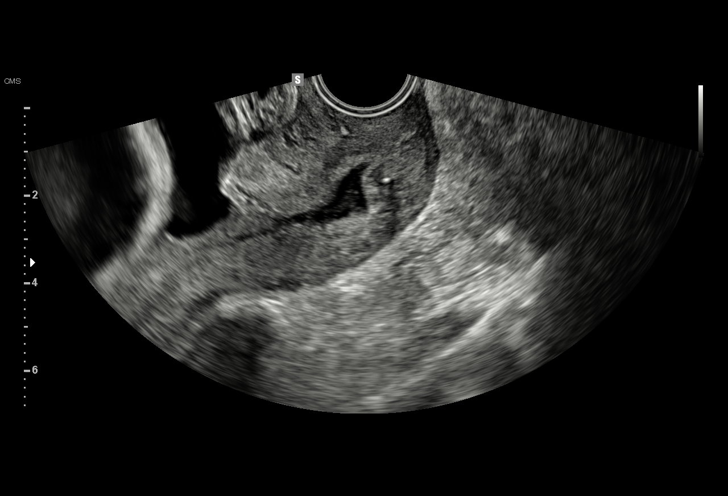
[im 63/71]
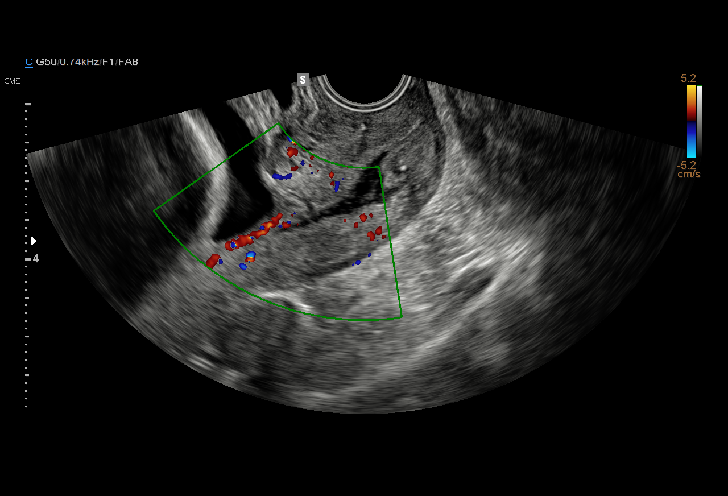
[im 68/71]
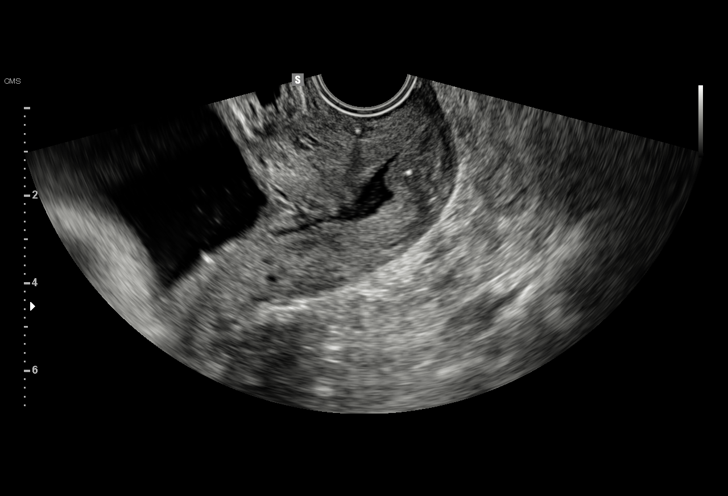

[13 of 28 positions shown; findings below may reference images not displayed]

[facility]

OB/Gyn Clinic
[REDACTED]-
Faculty Physician
OB/Gyn Clinic
[REDACTED]
Secondary Phy.:   Clinic [REDACTED]-
Faculty Physician

1  DAISEI ARIFUKU              [id number]      [phone]     [id number]
2  DAISEI ARIFUKU              [id number]      [phone]     [id number]
Indications

23 weeks gestation of pregnancy
Follow-up incomplete fetal anatomic            Z36
evaluation
Cervical insufficiency, 2nd (cerclage present)
Poor obstetric history: Previous midtrimester
loss (18 weeks)
Previous cesarean delivery, antepartum
History of neural tube defect in prior
pregnancy, currently pregnant; son with
repaired encephalocele
Polyp of cervix affecting pregnancy
OB History
Gravidity:    6         Term:   4        Prem:   0        SAB:   1
TOP:          0       Ectopic:  0        Living: 4
Fetal Evaluation

Num Of Fetuses:     1
Fetal Heart         145
Rate(bpm):
Cardiac Activity:   Observed
Presentation:       Cephalic
Placenta:           Anterior, above cervical os
P. Cord Insertion:  Visualized, central

Amniotic Fluid
AFI FV:      Subjectively increased

Largest Pocket(cm)
8.44
Biometry

BPD:      56.7  mm     G. Age:  23w 2d         41  %    CI:        71.11   %   70 - 86
FL/HC:      17.7   %   19.2 -
HC:      214.2  mm     G. Age:  23w 4d         36  %    HC/AC:      1.11       1.05 -
AC:      192.2  mm     G. Age:  23w 6d         58  %    FL/BPD:     67.0   %   71 - 87
FL:         38  mm     G. Age:  22w 1d          8  %    FL/AC:      19.8   %   20 - 24
HUM:      34.6  mm     G. Age:  21w 6d          8  %
CER:      25.5  mm     G. Age:  23w 4d         51  %

CM:        9.1  mm
Est. FW:     572  gm      1 lb 4 oz     49  %
Gestational Age

LMP:           23w 3d       Date:   08/29/15                 EDD:   06/04/16
U/S Today:     23w 2d                                        EDD:   06/05/16
Best:          23w 3d    Det. By:   LMP  (08/29/15)          EDD:   06/04/16
Anatomy

Cranium:               Appears normal         Aortic Arch:            Appears normal
Cavum:                 Appears normal         Ductal Arch:            Appears normal
Ventricles:            Appears normal         Diaphragm:              Appears normal
Choroid Plexus:        Previously seen        Stomach:                Appears normal, left
sided
Cerebellum:            Appears normal         Abdomen:                Appears normal
Posterior Fossa:       Appears normal         Abdominal Wall:         Appears nml (cord
insert, abd wall)
Nuchal Fold:           Previously seen        Cord Vessels:           Appears normal (3
vessel cord)
Face:                  Appears normal         Kidneys:                Appear normal
(orbits and profile)
Lips:                  Appears normal         Bladder:                Appears normal
Thoracic:              Appears normal         Spine:                  Previously seen
Heart:                 Appears normal         Upper Extremities:      Previously seen
(4CH, axis, and situs
RVOT:                  Appears normal         Lower Extremities:      Previously seen
LVOT:                  Appears normal
Other:  Male gender previously seen. Heels and 5th digit previously
visualized. Nasal bone visualized. Technically difficult due to fetal
position.
Cervix Uterus Adnexa

Cervix
Length:            3.5  cm.
Measured transvaginally. Polyp again noted at internal os.

Uterus
No abnormality visualized.

Left Ovary
Not visualized.

Right Ovary
Not visualized.

Cul De Sac:   No free fluid seen.

Adnexa:       No abnormality visualized.
Impression

Singleton intrauterine pregnancy at 23 weeks 3 days
gestation with fetal cardiac activity
Cephalic presentation
Normal appearing fetal growth, although there appears to be
polyhydramnios with single deepest pocket of >8cm
Cervical length 
3.5 cm. The cerclage stitch appears intact.
A small endocervical polyp is again noted near the internal os.
Recommendations

Recommend follow-up ultrasound examination in 
 4 weeks
for reevluation of amniotic fluid volume and cervical length

## 2018-05-14 ENCOUNTER — Encounter (HOSPITAL_COMMUNITY): Payer: Self-pay | Admitting: Emergency Medicine

## 2018-05-14 ENCOUNTER — Ambulatory Visit (HOSPITAL_COMMUNITY)
Admission: EM | Admit: 2018-05-14 | Discharge: 2018-05-14 | Disposition: A | Discharge status: Home / Self Care | Payer: Self-pay | Attending: Nurse Practitioner | Admitting: Nurse Practitioner

## 2018-05-14 DIAGNOSIS — H60501 Unspecified acute noninfective otitis externa, right ear: Secondary | ICD-10-CM

## 2018-05-14 DIAGNOSIS — L03311 Cellulitis of abdominal wall: Secondary | ICD-10-CM

## 2018-05-14 MED ORDER — SULFAMETHOXAZOLE-TRIMETHOPRIM 800-160 MG PO TABS: 1.0000 | ORAL_TABLET | Freq: Two times a day (BID) | ORAL | 0 refills | Status: AC

## 2018-05-14 MED ORDER — CIPROFLOXACIN-DEXAMETHASONE 0.3-0.1 % OT SUSP: 4.0000 [drp] | Freq: Two times a day (BID) | OTIC | 0 refills | Status: AC

## 2018-05-14 NOTE — ED Provider Notes (Signed)
[facility]    CSN: [id number] Arrival date & time: 05/14/18  1803     History   Chief Complaint Chief Complaint  Patient presents with  . Wound Check    HPI Amy [name] is a 37 y.o. female.   Subjective:   Amy [name] is a 37 y.o. female who presents for possible ear infection. Symptoms include right ear drainage . Onset of symptoms was several months ago, unchanged since that time. She denies any congestion, tinnitus, difficulty hearing, cough, headache or rhinorrhea.   The patient also presents for evaluation of a possible skin infection located just below the pubic hairline at the site of her prior cesarean section 16 years ago.  Onset of symptoms was abrupt starting 2 days ago, with unchanged course since that time. Symptoms include redness, soreness and bleeding. Patient denies chills, fever or purulent drainage. There is not a history of trauma to the area. Treatment to date has included none.   The following portions of the patient's history were reviewed and updated as appropriate: allergies, current medications, past family history, past medical history, past social history, past surgical history and problem list.       Past Medical History:  Diagnosis Date  . Medical history non-contributory   . Preterm labor     Patient Active Problem List   Diagnosis Date Noted  . Tinea versicolor 12/30/2013    Past Surgical History:  Procedure Laterality Date  . CERVICAL CERCLAGE N/A 01/13/2014   Procedure: CERCLAGE CERVICAL;  Surgeon: Adam Phenix, MD;  Location: [facility];  Service: Gynecology;  Laterality: N/A;  . CERVICAL CERCLAGE N/A 12/22/2015   Procedure: CERCLAGE CERVICAL;  Surgeon: Willodean Rosenthal, MD;  Location: [facility];  Service: Gynecology;  Laterality: N/A;  . CESAREAN SECTION     one previous    OB History    Gravida  6   Para  6   Term  5   Preterm  1   AB  0   Living  5     SAB  0   TAB  0     Ectopic  0   Multiple  0   Live Births  6            Home Medications    Prior to Admission medications   Medication Sig Start Date End Date Taking? Authorizing Provider  ciprofloxacin-dexamethasone (CIPRODEX) OTIC suspension Place 4 drops into the right ear 2 (two) times daily for 7 days. 05/14/18 05/21/18  Lurline Idol, FNP  sulfamethoxazole-trimethoprim (BACTRIM DS,SEPTRA DS) 800-160 MG tablet Take 1 tablet by mouth 2 (two) times daily for 7 days. 05/14/18 05/21/18  Lurline Idol, FNP    Family History Family History  Problem Relation Age of Onset  . Diabetes Father   . Diabetes Sister   . Hypertension Sister   . Heart disease Sister        has pacemaker  . Cancer Sister     Social History Social History   Tobacco Use  . Smoking status: Never Smoker  . Smokeless tobacco: Never Used  Substance Use Topics  . Alcohol use: No  . Drug use: No     Allergies   Penicillins   Review of Systems Review of Systems  Constitutional: Negative for chills, diaphoresis and fever.  HENT: Positive for ear discharge. Negative for ear pain and tinnitus.   Skin:       Redness, pain and discharge from prior c-section incision   All  other systems reviewed and are negative.    Physical Exam Triage Vital Signs ED Triage Vitals [05/14/18 1831]  Enc Vitals Group     BP 104/69     Pulse Rate 74     Resp 16     Temp 98.4 F (36.9 C)     Temp src      SpO2 98 %     Weight      Height      Head Circumference      Peak Flow      Pain Score      Pain Loc      Pain Edu?      Excl. in GC?    No data found.  Updated Vital Signs BP 104/69   Pulse 74   Temp 98.4 F (36.9 C)   Resp 16   SpO2 98%   Breastfeeding? Yes   Visual Acuity Right Eye Distance:   Left Eye Distance:   Bilateral Distance:    Right Eye Near:   Left Eye Near:    Bilateral Near:     Physical Exam  Constitutional: She is oriented to person, place, and time. She appears  well-developed and well-nourished.  HENT:  Right Ear: Tympanic membrane and external ear normal.  Right erythematous canal   Neck: Normal range of motion. Neck supple.  Cardiovascular: Normal rate and regular rhythm.  Pulmonary/Chest: Effort normal and breath sounds normal.  Musculoskeletal: Normal range of motion.  Neurological: She is alert and oriented to person, place, and time.  Skin: Skin is warm and dry.     Psychiatric: She has a normal mood and affect.     UC Treatments / Results  Labs (all labs ordered are listed, but only abnormal results are displayed) Labs Reviewed - No data to display  EKG None  Radiology No results found.  Procedures Procedures (including critical care time)  Medications Ordered in UC Medications - No data to display  Initial Impression / Assessment and Plan / UC Course  I have reviewed the triage vital signs and the nursing notes.  Pertinent labs & imaging results that were available during my care of the patient were reviewed by me and considered in my medical decision making (see chart for details).    37 yo female presenting with a two-day history of redness, soreness and bloody drainage at the site of her previous c-section scar from 16 years ago. On exam, there is redness but no open areas or drainage noted. Will treat with course of bactrim for possible cellulitis. The patient also reports drainage from her right ear for the past several months. On exam, patient has erythematous ear canal. TM appears normal. Will treat with course of ciprodex. Patient advised of expected course of illness and indications for follow-up  Today's evaluation has revealed no signs of a dangerous process. Discussed diagnosis with patient. Patient aware of their diagnosis, possible red flag symptoms to watch out for and need for close follow up. Patient understands verbal and written discharge instructions. Patient comfortable with plan and disposition.   Patient has a clear mental status at this time, good insight into illness (after discussion and teaching) and has clear judgment to make decisions regarding their care.  Documentation was completed with the aid of voice recognition software. Transcription may contain typographical errors.  Final Clinical Impressions(s) / UC Diagnoses   Final diagnoses:  Cellulitis of abdominal wall  Acute otitis externa of right ear, unspecified type  Discharge Instructions   None    ED Prescriptions    Medication Sig Dispense Auth. Provider   ciprofloxacin-dexamethasone (CIPRODEX) OTIC suspension Place 4 drops into the right ear 2 (two) times daily for 7 days. 2.8 mL Lurline Idol, FNP   sulfamethoxazole-trimethoprim (BACTRIM DS,SEPTRA DS) 800-160 MG tablet Take 1 tablet by mouth 2 (two) times daily for 7 days. 14 tablet Lurline Idol, FNP     Controlled Substance Prescriptions [place] Controlled Substance Registry consulted? Not Applicable   Lurline Idol, FNP 05/14/18 1901

## 2018-05-14 NOTE — ED Triage Notes (Signed)
Pt had a c section 16 years ago and yesterday she looked at the c section scar was bleeding.

## 2018-09-16 ENCOUNTER — Ambulatory Visit (HOSPITAL_COMMUNITY)
Admission: EM | Admit: 2018-09-16 | Discharge: 2018-09-16 | Disposition: A | Discharge status: Home / Self Care | Payer: Self-pay | Attending: Family Medicine | Admitting: Family Medicine

## 2018-09-16 ENCOUNTER — Encounter (HOSPITAL_COMMUNITY): Payer: Self-pay

## 2018-09-16 DIAGNOSIS — H66016 Acute suppurative otitis media with spontaneous rupture of ear drum, recurrent, bilateral: Secondary | ICD-10-CM | POA: Insufficient documentation

## 2018-09-16 MED ORDER — NEOMYCIN-POLYMYXIN-HC 3.5-10000-1 OT SOLN: 3.0000 [drp] | Freq: Four times a day (QID) | OTIC | 0 refills | Status: DC

## 2018-09-16 MED ORDER — DOXYCYCLINE HYCLATE 100 MG PO TABS: 100.0000 mg | ORAL_TABLET | Freq: Two times a day (BID) | ORAL | 0 refills | Status: DC

## 2018-09-16 NOTE — ED Provider Notes (Signed)
[facility]    CSN: [id number] Arrival date & time: 09/16/18  1452     History   Chief Complaint Chief Complaint  Patient presents with  . Otalgia    HPI Gabrielly Reginia Fortsnriquez-Madariaga is a 38 y.o. female.   This a [place] urgent care established patient who complains of lt ear drainage x 5 months.  Both ears bother her.  She's had this problem before.     Past Medical History:  Diagnosis Date  . Medical history non-contributory   . Preterm labor     Patient Active Problem List   Diagnosis Date Noted  . Tinea versicolor 12/30/2013    Past Surgical History:  Procedure Laterality Date  . CERVICAL CERCLAGE N/A 01/13/2014   Procedure: CERCLAGE CERVICAL;  Surgeon: Adam PhenixJames G Arnold, MD;  Location: [facility];  Service: Gynecology;  Laterality: N/A;  . CERVICAL CERCLAGE N/A 12/22/2015   Procedure: CERCLAGE CERVICAL;  Surgeon: Willodean Rosenthalarolyn Harraway-Smith, MD;  Location: [facility];  Service: Gynecology;  Laterality: N/A;  . CESAREAN SECTION     one previous    OB History    Gravida  6   Para  6   Term  5   Preterm  1   AB  0   Living  5     SAB  0   TAB  0   Ectopic  0   Multiple  0   Live Births  6            Home Medications    Prior to Admission medications   Medication Sig Start Date End Date Taking? Authorizing Provider  doxycycline (VIBRA-TABS) 100 MG tablet Take 1 tablet (100 mg total) by mouth 2 (two) times daily. 09/16/18   Elvina SidleLauenstein, Chelsee Hosie, MD  neomycin-polymyxin-hydrocortisone (CORTISPORIN) OTIC solution Place 3 drops into the right ear 4 (four) times daily. 09/16/18   Elvina SidleLauenstein, Allicia Culley, MD    Family History Family History  Problem Relation Age of Onset  . Diabetes Father   . Diabetes Sister   . Hypertension Sister   . Heart disease Sister        has pacemaker  . Cancer Sister     Social History Social History   Tobacco Use  . Smoking status: Never Smoker  . Smokeless tobacco: Never Used  Substance Use Topics  . Alcohol  use: No  . Drug use: No     Allergies   Penicillins   Review of Systems Review of Systems   Physical Exam Triage Vital Signs ED Triage Vitals [09/16/18 1516]  Enc Vitals Group     BP 98/79     Pulse Rate 98     Resp 18     Temp 98.3 F (36.8 C)     Temp Source Oral     SpO2 100 %     Weight      Height      Head Circumference      Peak Flow      Pain Score 0     Pain Loc      Pain Edu?      Excl. in GC?    No data found.  Updated Vital Signs BP 98/79 (BP Location: Left Arm)   Pulse 98   Temp 98.3 F (36.8 C) (Oral)   Resp 18   LMP 09/02/2018   SpO2 100%    Physical Exam Vitals signs and nursing note reviewed.  Constitutional:      Appearance: Normal appearance.  HENT:     Ears:     Comments: Bilateral purulent drainage in each of the canals. Neurological:     Mental Status: She is alert.      UC Treatments / Results  Labs (all labs ordered are listed, but only abnormal results are displayed) Labs Reviewed - No data to display  EKG None  Radiology No results found.  Procedures Procedures (including critical care time)  Medications Ordered in UC Medications - No data to display  Initial Impression / Assessment and Plan / UC Course  I have reviewed the triage vital signs and the nursing notes.  Pertinent labs & imaging results that were available during my care of the patient were reviewed by me and considered in my medical decision making (see chart for details).    Final Clinical Impressions(s) / UC Diagnoses   Final diagnoses:  Recurrent acute suppurative otitis media with spontaneous rupture of both tympanic membranes   Discharge Instructions   None    ED Prescriptions    Medication Sig Dispense Auth. Provider   neomycin-polymyxin-hydrocortisone (CORTISPORIN) OTIC solution Place 3 drops into the right ear 4 (four) times daily. 10 mL Elvina SidleLauenstein, Keah Lamba, MD   doxycycline (VIBRA-TABS) 100 MG tablet Take 1 tablet (100 mg total)  by mouth 2 (two) times daily. 20 tablet Elvina SidleLauenstein, Jaylena Holloway, MD     Controlled Substance Prescriptions [place] Controlled Substance Registry consulted? Not Applicable   Elvina SidleLauenstein, Aviv Rota, MD 09/16/18 [phone]

## 2018-09-16 NOTE — ED Triage Notes (Signed)
Pt c/o lt ear drainage x5 months

## 2019-08-24 ENCOUNTER — Ambulatory Visit: Payer: HRSA Program | Attending: Internal Medicine

## 2019-08-24 DIAGNOSIS — Z20828 Contact with and (suspected) exposure to other viral communicable diseases: Secondary | ICD-10-CM | POA: Diagnosis present

## 2019-08-24 DIAGNOSIS — Z20822 Contact with and (suspected) exposure to covid-19: Secondary | ICD-10-CM

## 2019-08-26 LAB — NOVEL CORONAVIRUS, NAA: SARS-CoV-2, NAA: NOT DETECTED

## 2019-10-06 ENCOUNTER — Ambulatory Visit (HOSPITAL_COMMUNITY)
Admission: EM | Admit: 2019-10-06 | Discharge: 2019-10-06 | Disposition: A | Discharge status: Home / Self Care | Payer: Self-pay | Attending: Urgent Care | Admitting: Urgent Care

## 2019-10-06 ENCOUNTER — Other Ambulatory Visit: Payer: Self-pay

## 2019-10-06 ENCOUNTER — Encounter (HOSPITAL_COMMUNITY): Payer: Self-pay

## 2019-10-06 DIAGNOSIS — M545 Low back pain, unspecified: Secondary | ICD-10-CM

## 2019-10-06 DIAGNOSIS — M549 Dorsalgia, unspecified: Secondary | ICD-10-CM

## 2019-10-06 DIAGNOSIS — M25511 Pain in right shoulder: Secondary | ICD-10-CM

## 2019-10-06 LAB — POCT URINALYSIS DIP (DEVICE)
Bilirubin Urine: NEGATIVE
Glucose, UA: NEGATIVE mg/dL
Hgb urine dipstick: NEGATIVE
Ketones, ur: NEGATIVE mg/dL
Leukocytes,Ua: NEGATIVE
Nitrite: NEGATIVE
Protein, ur: NEGATIVE mg/dL
Specific Gravity, Urine: >=1.03 (ref 1.005–1.030)
Urobilinogen, UA: 0.2 mg/dL (ref 0.0–1.0)
pH: 6.5 (ref 5.0–8.0)

## 2019-10-06 MED ORDER — TIZANIDINE HCL 4 MG PO TABS: 4.0000 mg | ORAL_TABLET | Freq: Every day | ORAL | 0 refills | Status: DC

## 2019-10-06 MED ORDER — MELOXICAM 7.5 MG PO TABS: 7.5000 mg | ORAL_TABLET | Freq: Every day | ORAL | 0 refills | Status: DC
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
## 2019-10-22 ENCOUNTER — Encounter (HOSPITAL_COMMUNITY): Payer: Self-pay

## 2019-10-22 ENCOUNTER — Other Ambulatory Visit: Payer: Self-pay

## 2019-10-22 ENCOUNTER — Ambulatory Visit (HOSPITAL_COMMUNITY)
Admission: EM | Admit: 2019-10-22 | Discharge: 2019-10-22 | Disposition: A | Discharge status: Home / Self Care | Payer: Self-pay | Attending: Family Medicine | Admitting: Family Medicine

## 2019-10-22 DIAGNOSIS — G44209 Tension-type headache, unspecified, not intractable: Secondary | ICD-10-CM

## 2019-10-22 DIAGNOSIS — M546 Pain in thoracic spine: Secondary | ICD-10-CM

## 2019-10-22 DIAGNOSIS — S46811A Strain of other muscles, fascia and tendons at shoulder and upper arm level, right arm, initial encounter: Secondary | ICD-10-CM

## 2019-10-22 DIAGNOSIS — M79601 Pain in right arm: Secondary | ICD-10-CM

## 2019-10-22 MED ORDER — NAPROXEN 500 MG PO TABS: 500.0000 mg | ORAL_TABLET | Freq: Two times a day (BID) | ORAL | 0 refills | Status: DC | PRN

## 2019-10-22 MED ORDER — DEXAMETHASONE SODIUM PHOSPHATE 10 MG/ML IJ SOLN
10.0000 mg | Freq: Once | INTRAMUSCULAR | Status: AC
  Administered 2019-10-22: 10 mg via INTRAMUSCULAR

## 2019-10-22 MED ORDER — DEXAMETHASONE SODIUM PHOSPHATE 10 MG/ML IJ SOLN
INTRAMUSCULAR | Status: AC
  Filled 2019-10-22: qty 1

## 2019-10-22 MED ORDER — CYCLOBENZAPRINE HCL 5 MG PO TABS: 5.0000 mg | ORAL_TABLET | Freq: Two times a day (BID) | ORAL | 0 refills | Status: DC | PRN

## 2019-10-22 MED ORDER — KETOROLAC TROMETHAMINE 60 MG/2ML IM SOLN
60.0000 mg | Freq: Once | INTRAMUSCULAR | Status: AC
  Administered 2019-10-22: 60 mg via INTRAMUSCULAR

## 2019-10-22 MED ORDER — KETOROLAC TROMETHAMINE 60 MG/2ML IM SOLN
INTRAMUSCULAR | Status: AC
  Filled 2019-10-22: qty 2

## 2019-10-22 MED ORDER — PREDNISONE 10 MG PO TABS: ORAL_TABLET | ORAL | 0 refills | Status: DC
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
## 2019-10-22 NOTE — Discharge Instructions (Signed)
Da inyecciones de toradol y decadron  Empiece prednisone en la manana, toma 6 pastila dia 1, 5 pastilla dia 2, 4 pastillas dia 3, 3 pastillas dia 4, 2 pastillas dia 5, 1 pastilla dia 6- toma con comida y en la manana si puede [place] flexeril/cyclobenzeprine (relajante de musculo) en la casa o antes de cama, puede causa suenos, no manejar o trabajar despues de tomando Depues de completa la curso de prednisone puede [place] naprosyn 2 veces cada dia con comida si necesita para dolor de [place] y cuepro (espalda, cuello, hombro, brazo)  Regrese si todavia no mejoran con Engineer, mining, tiene mas dolor de Training and development officer
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
## 2021-08-30 ENCOUNTER — Other Ambulatory Visit: Payer: Self-pay

## 2021-08-30 ENCOUNTER — Ambulatory Visit: Payer: Self-pay | Admitting: Physician Assistant

## 2021-08-30 VITALS — BP 105/70 | HR 88 | Temp 98.2°F | Resp 18 | Ht 60.0 in | Wt 161.0 lb

## 2021-08-30 DIAGNOSIS — E6609 Other obesity due to excess calories: Secondary | ICD-10-CM

## 2021-08-30 DIAGNOSIS — R55 Syncope and collapse: Secondary | ICD-10-CM

## 2021-08-30 DIAGNOSIS — Z0001 Encounter for general adult medical examination with abnormal findings: Secondary | ICD-10-CM

## 2021-08-30 DIAGNOSIS — Z0000 Encounter for general adult medical examination without abnormal findings: Secondary | ICD-10-CM

## 2021-08-30 DIAGNOSIS — Z6831 Body mass index (BMI) 31.0-31.9, adult: Secondary | ICD-10-CM

## 2021-08-30 DIAGNOSIS — F439 Reaction to severe stress, unspecified: Secondary | ICD-10-CM

## 2021-08-30 MED ORDER — HYDROXYZINE HCL 25 MG PO TABS: 25.0000 mg | ORAL_TABLET | Freq: Every evening | ORAL | 0 refills | Status: AC | PRN

## 2021-08-30 NOTE — Progress Notes (Signed)
Patient has eaten today. Patient is not on any chronic medications. Patient denies pain at this time.

## 2021-08-30 NOTE — Patient Instructions (Signed)
To help reduce your stress, I do encourage you to make sure that you are sleeping 7 to 8 hours every night, you can use the hydroxyzine that I prescribed to help you with sleep.  I also encourage you to increase your daily water intake, you should drink at least 64 ounces of water a day.  We will call you with today's lab results and let you know where we will be in 2 weeks for you to return for follow-up.  Kennieth Rad, PA-C Physician Assistant [facility] [email or web address]   Control del estrs en los adultos Managing Stress, Adult Sentir un cierto nivel de estrs es normal. El estrs ayuda a que el cuerpo y la mente se preparen para enfrentar las exigencias de la vida. Las hormonas del estrs pueden motivarlo a hacer bien su trabajo y a cumplir con sus responsabilidades. Sin embargo, el estrs intenso o prolongado (crnico) puede Print production planner su salud mental y fsica. El estrs crnico lo pone en mayor riesgo de: Ansiedad y depresin. Otros problemas de [place], como problemas digestivos, dolores musculares, enfermedad cardaca, presin arterial alta y accidente cerebrovascular. Cules son las causas? Las causas frecuentes de estrs son las siguientes: Las exigencias del trabajo, como las fechas lmite, la sensacin de Fish farm manager en exceso o el trabajar muchas horas. Las Paramedic, como problemas de dinero, desacuerdos con un cnyuge o problemas en la crianza de los nios. Las presiones por [employer] en la vida, como un divorcio, [place], prdida de un ser querido o enfermedad crnica. Puede tener ms riesgo de experimentar problemas relacionados con el estrs si: No duerme lo suficiente. [place]. No tiene apoyo emocional. Tiene un trastorno de salud mental, como ansiedad o depresin. Cmo reconocer el estrs El estrs puede hacer que usted: Tenga dificultad para dormir. Se sienta triste, ansioso,  irritable o abrumado. Pierda el apetito. Coma en exceso o tenga deseos de comer alimentos poco saludables. Tenga deseos de consumir drogas o alcohol. El estrs tambin puede causar sntomas fsicos, por ejemplo: Msculos tensos y doloridos, especialmente en los hombros y el cuello. Dolores de [place]. Dificultad para respirar. Frecuencia cardaca ms rpida. Dolor de [place], nuseas o vmitos. Diarrea o estreimiento. Dificultad para concentrarse. Siga estas instrucciones en su casa: Comida y bebida Siga una dieta saludable. Esto puede comprender lo siguiente: Consuma alimentos ricos en fibra, como frijoles, cereales integrales, y frutas y verduras frescas. Limite el consumo de alimentos ricos en grasas y azcares procesados, como los alimentos fritos o dulces. No saltee comidas ni coma en exceso. Beba suficiente lquido como para Theatre manager la orina de color amarillo plido. Consumo de alcohol No beba alcohol si: Su mdico le indica no hacerlo. Est embarazada, puede estar embarazada o est tratando de [place]. El consumo de alcohol es una forma en la que algunas personas tratan de Public house manager el estrs. Esto puede ser peligroso, por lo que si bebe alcohol: Limite la cantidad que bebe a lo siguiente: De 0 a 1 medida por da para las mujeres. De 0 a 2 medidas por da para los hombres. Sepa cunta cantidad de alcohol hay en las bebidas que toma. En los [place], una medida equivale a una botella de cerveza de 12 oz (355 ml), un vaso de vino de 5 oz (148 ml) o un vaso de una bebida alcohlica de alta graduacin de 1 oz (44 ml). Actividad  Incluya 30 minutos de ejercicio en su cronograma diario. El ejercicio es un buen recurso  para reducir Dealer. Lloyd Huger en su da para realizar una actividad que le resulte relajante. Trata de caminar, andar en bicicleta, leer un libro o escuchar msica. Programe su tiempo de una manera que reduzca el estrs y [place] un cronograma  regular. Cntrese en hacer lo que es ms importante. Estilo de vida Identifique el origen del estrs y su reaccin a Patent examiner. Consulte a un terapeuta que pueda ayudarlo a Soil scientist. Cuando se presenten acontecimientos estresantes: Hable al respecto con su familia, amigos o compaeros de [place]. Trate de pensar de un modo realista los acontecimientos estresantes y de no ignorarlos ni Horticulturist, commercial. Intente encontrar los aspectos positivos en una situacin estresante y de no enfocarse en los negativos. Reduzca sus responsabilidades en el trabajo y en su casa, si es posible. Pida ayuda a sus amigos o familiares si la necesita. Busque maneras de [employer], por ejemplo: Atencin plena (mindfulness), meditacin o respiracin profunda. Yoga o tai chi. Relajacin muscular progresiva. Pasar tiempo en contacto con la naturaleza. Hacer arte, tocar msica o leer. Hacerse tiempo para Data processing manager divertidas. Pasar tiempo con amigos y familiares. Recibir apoyo de familiares, amigos o recursos espirituales. Instrucciones generales [place] lo suficiente. Trate de irse a dormir y Holiday representative a la misma hora todos [place]. Use los medicamentos de venta libre y los recetados solamente como se lo haya indicado el mdico. No consuma ningn producto que contenga nicotina o tabaco. Estos productos incluyen cigarrillos, tabaco para Higher education careers adviser y aparatos de vapeo, como los Psychologist, sport and exercise. Si necesita ayuda para dejar de fumar, consulte al mdico. No consuma drogas ni fume para hacer frente al estrs. Concurra a [place]. Esto es importante. Dnde buscar apoyo Converse con el mdico sobre cmo controlar el estrs o buscar un grupo de apoyo. Busque un terapeuta para que trabaje con usted sobre las tcnicas de control del estrs. Dnde buscar ms informacin [employer] on Mental Illness ([place]): www.nami.org American Psychological Association (Asociacin Estadounidense de Psicologa): TVStereos.ch Comunquese con un mdico si: Sus sntomas de Geneticist, molecular. No puede controlar el estrs en su casa. Tiene dificultades para dejar de consumir drogas o alcohol. Solicite ayuda de inmediato si: Podra ser un peligro para usted mismo o para los dems. Tiene cualquier pensamiento de muerte o suicidio. Busque ayuda de inmediatosi alguna vez siente que puede hacerse dao a usted mismo o a otros, o tiene pensamientos de Doctor, hospital a su vida. Dirjase al centro de urgencias ms cercano o: Llame al 911. Llame a la National Suicide Prevention Lifeline ([place] para la Prevencin del Suicidio) al [phone] o al 988. Est disponible las 24 horas del da. Enve un mensaje de texto a la lnea para casos de crisis al [phone]. Resumen Sentir un cierto nivel de estrs es normal, pero el estrs intenso o prolongado (crnico) puede Print production planner su salud mental y fsica. El estrs crnico puede aumentar el riesgo de sufrir ansiedad, depresin y otros problemas de [place], como problemas digestivos, dolores musculares, enfermedades cardacas, presin arterial alta y accidente cerebrovascular. Usted puede correr un riesgo ms alto de tener problemas relacionados con el estrs si no duerme lo suficiente, no tiene buena salud, carece de [employer] o tiene un trastorno de salud mental como ansiedad o depresin. Identifique el origen del estrs y su reaccin a este. Trate de [employer] acontecimientos estresantes con familiares, amigos o compaeros de [place], Pension scheme manager un mtodo para  lidiar con la situacin o de obtener 79 de recursos espirituales. Si necesita ms ayuda, hable con el mdico para encontrar un grupo de apoyo o un terapeuta de salud mental. Esta informacin no tiene Marine scientist el consejo del mdico. Asegrese de hacerle al mdico cualquier  pregunta que tenga. Document Revised: 04/05/2021 Document Reviewed: 04/05/2021 Elsevier Patient Education  [place].

## 2021-08-30 NOTE — Progress Notes (Signed)
New Patient Office Visit  Subjective:  Patient ID: Amy [name], female    DOB: October 31, 1980  Age: 41 y.o. MRN: [id number]  CC:  Chief Complaint  Patient presents with   Annual Exam    HPI Amy [name] presents for a wellness exam with complaint of episode of syncope on August 19, 2021.  Reports that she was home alone, was experiencing a headache and a "strong stomachache, states that she was sitting on the toilet having a bowel movement when she believes that she passed out.  States that she woke up on the floor, believes that she was "out" for about 3 minutes.  States that she did not get evaluated, states she chose to go to bed instead.  Denies losing her bladder, hitting her head.  States that this has not happened previously and has not happened again since.  States that she has been having very high stress levels, states that her mother is hospitalized currently in [place] and keeps telling her that she is going to die in the hospital whenever she speaks with her.  Reports that she has been having difficulty sleeping due to this elevated stress, has not tried anything for relief  Reports that she is drinking approximately 1 glass of water a day.  Due to language barrier, an interpreter was present during the history-taking and subsequent discussion (and for part of the physical exam) with this patient.    Past Medical History:  Diagnosis Date   Medical history non-contributory    Preterm labor     Past Surgical History:  Procedure Laterality Date   CERVICAL CERCLAGE N/A 01/13/2014   Procedure: CERCLAGE CERVICAL;  Surgeon: Woodroe Mode, MD;  Location: [facility];  Service: Gynecology;  Laterality: N/A;   CERVICAL CERCLAGE N/A 12/22/2015   Procedure: CERCLAGE CERVICAL;  Surgeon: Lavonia Drafts, MD;  Location: [facility];  Service: Gynecology;  Laterality: N/A;   CESAREAN SECTION     one previous    Family History  Problem Relation Age of  Onset   Diabetes Father    Diabetes Sister    Hypertension Sister    Heart disease Sister        has pacemaker   Cancer Sister     Social History   Socioeconomic History   Marital status: Single    Spouse name: Not on file   Number of children: Not on file   Years of education: Not on file   Highest education level: Not on file  Occupational History   Not on file  Tobacco Use   Smoking status: Never   Smokeless tobacco: Never  Vaping Use   Vaping Use: Never used  Substance and Sexual Activity   Alcohol use: No   Drug use: No   Sexual activity: Yes    Birth control/protection: None  Other Topics Concern   Not on file  Social History Narrative   Not on file   Social Determinants of Health   Financial Resource Strain: Not on file  Food Insecurity: Not on file  Transportation Needs: Not on file  Physical Activity: Not on file  Stress: Not on file  Social Connections: Not on file  Intimate Partner Violence: Not on file    ROS Review of Systems  Constitutional:  Negative for chills and fever.  HENT: Negative.    Eyes:  Negative for photophobia.  Respiratory:  Negative for shortness of breath.   Cardiovascular:  Negative for chest pain.  Gastrointestinal:  Negative for  abdominal pain, nausea and vomiting.  Endocrine: Negative.   Genitourinary: Negative.   Musculoskeletal: Negative.   Skin: Negative.   Neurological:  Negative for dizziness, speech difficulty, weakness and headaches.  Hematological: Negative.   Psychiatric/Behavioral:  Positive for sleep disturbance. Negative for self-injury and suicidal ideas. The patient is nervous/anxious.    Objective:   Today's Vitals: BP 105/70 (BP Location: Left Arm, Patient Position: Sitting, Cuff Size: Normal)    Pulse 88    Temp 98.2 F (36.8 C) (Oral)    Resp 18    Ht 5' (1.524 m)    Wt 161 lb (73 kg)    LMP 08/30/2021    SpO2 98%    BMI 31.44 kg/m   Physical Exam Vitals and nursing note reviewed.   Constitutional:      Appearance: Normal appearance.  HENT:     Head: Normocephalic and atraumatic.     Right Ear: External ear normal.     Left Ear: External ear normal.     Nose: Nose normal.     Mouth/Throat:     Mouth: Mucous membranes are moist.     Pharynx: Oropharynx is clear.  Eyes:     Extraocular Movements: Extraocular movements intact.     Conjunctiva/sclera: Conjunctivae normal.     Pupils: Pupils are equal, round, and reactive to light.  Cardiovascular:     Rate and Rhythm: Normal rate and regular rhythm.     Pulses: Normal pulses.     Heart sounds: Normal heart sounds.  Pulmonary:     Effort: Pulmonary effort is normal.     Breath sounds: Normal breath sounds.  Musculoskeletal:        General: Normal range of motion.     Cervical back: Normal range of motion and neck supple.  Skin:    General: Skin is warm and dry.  Neurological:     General: No focal deficit present.     Mental Status: She is alert and oriented to person, place, and time.     Cranial Nerves: No cranial nerve deficit.     Sensory: No sensory deficit.  Psychiatric:        Mood and Affect: Mood normal.        Behavior: Behavior normal.        Thought Content: Thought content normal.        Judgment: Judgment normal.    Assessment & Plan:   Problem List Items Addressed This Visit   None Visit Diagnoses     Wellness examination    -  Primary   Syncope, unspecified syncope type       Relevant Orders   CBC with Differential/Platelet (Completed)   Comp. Metabolic Panel (12) (Completed)   TSH (Completed)   Stress at home       Relevant Medications   hydrOXYzine (ATARAX) 25 MG tablet       Outpatient Encounter Medications as of 08/30/2021  Medication Sig   hydrOXYzine (ATARAX) 25 MG tablet Take 1 tablet (25 mg total) by mouth at bedtime as needed.   [DISCONTINUED] cyclobenzaprine (FLEXERIL) 5 MG tablet Take 1-2 tablets (5-10 mg total) by mouth 2 (two) times daily as needed for muscle  spasms.   [DISCONTINUED] naproxen (NAPROSYN) 500 MG tablet Take 1 tablet (500 mg total) by mouth 2 (two) times daily as needed. Empiece despues de terminando prednisone si necesita para dolor de [place] y cuerpo   [DISCONTINUED] predniSONE (DELTASONE) 10 MG tablet Begin with 6 tabs on  day 1, 5 tab on day 2, 4 tab on day 3, 3 tab on day 4, 2 tab on day 5, 1 tab on day 6-take with food   No facility-administered encounter medications on file as of 08/30/2021.  1. Wellness examination Patient to return to mobile unit in 2 weeks for follow-up, patient will be assigned primary care provider at that time.  Patient understands and agrees  2. Syncope, unspecified syncope type Patient education given on lifestyle modifications to help reduce stress, increase hydration, red flags for prompt reevaluation - CBC with Differential/Platelet - Comp. Metabolic Panel (12) - TSH  3. Stress at home Encourage patient to improve sleep hygiene, trial hydroxyzine at bedtime as needed - hydrOXYzine (ATARAX) 25 MG tablet; Take 1 tablet (25 mg total) by mouth at bedtime as needed.  Dispense: 30 tablet; Refill: 0  4. Class 1 obesity due to excess calories with body mass index (BMI) of 31.0 to 31.9 in adult, unspecified whether serious comorbidity present    I have reviewed the patient's medical history (PMH, PSH, Social History, Family History, Medications, and allergies) , and have been updated if relevant. I spent 30 minutes reviewing chart and  face to face time with patient.     Follow-up: Return in about 2 weeks (around 09/13/2021) for with MMU.   Loraine Grip Mayers, PA-C

## 2021-08-31 DIAGNOSIS — F439 Reaction to severe stress, unspecified: Secondary | ICD-10-CM | POA: Insufficient documentation

## 2021-08-31 DIAGNOSIS — E6609 Other obesity due to excess calories: Secondary | ICD-10-CM | POA: Insufficient documentation

## 2021-08-31 DIAGNOSIS — R55 Syncope and collapse: Secondary | ICD-10-CM | POA: Insufficient documentation

## 2021-08-31 LAB — CBC WITH DIFFERENTIAL/PLATELET
Basophils Absolute: 0 10*3/uL (ref 0.0–0.2)
Basos: 1 %
EOS (ABSOLUTE): 0.2 10*3/uL (ref 0.0–0.4)
Eos: 2 %
Hematocrit: 40.4 % (ref 34.0–46.6)
Hemoglobin: 13.3 g/dL (ref 11.1–15.9)
Immature Grans (Abs): 0 10*3/uL (ref 0.0–0.1)
Immature Granulocytes: 0 %
Lymphocytes Absolute: 2.3 10*3/uL (ref 0.7–3.1)
Lymphs: 27 %
MCH: 28.9 pg (ref 26.6–33.0)
MCHC: 32.9 g/dL (ref 31.5–35.7)
MCV: 88 fL (ref 79–97)
Monocytes Absolute: 0.4 10*3/uL (ref 0.1–0.9)
Monocytes: 4 %
Neutrophils Absolute: 5.7 10*3/uL (ref 1.4–7.0)
Neutrophils: 66 %
Platelets: 266 10*3/uL (ref 150–450)
RBC: 4.61 x10E6/uL (ref 3.77–5.28)
RDW: 12.4 % (ref 11.7–15.4)
WBC: 8.6 10*3/uL (ref 3.4–10.8)

## 2021-08-31 LAB — COMP. METABOLIC PANEL (12)
AST: 19 IU/L (ref 0–40)
Albumin/Globulin Ratio: 1.6 (ref 1.2–2.2)
Albumin: 4.3 g/dL (ref 3.8–4.8)
Alkaline Phosphatase: 66 IU/L (ref 44–121)
BUN/Creatinine Ratio: 27 — ABNORMAL HIGH (ref 9–23)
BUN: 14 mg/dL (ref 6–20)
Bilirubin Total: 0.5 mg/dL (ref 0.0–1.2)
Calcium: 9 mg/dL (ref 8.7–10.2)
Chloride: 104 mmol/L (ref 96–106)
Creatinine, Ser: 0.51 mg/dL — ABNORMAL LOW (ref 0.57–1.00)
Globulin, Total: 2.7 g/dL (ref 1.5–4.5)
Glucose: 77 mg/dL (ref 70–99)
Potassium: 3.8 mmol/L (ref 3.5–5.2)
Sodium: 138 mmol/L (ref 134–144)
Total Protein: 7 g/dL (ref 6.0–8.5)
eGFR: 122 mL/min/{1.73_m2} (ref >59)

## 2021-08-31 LAB — TSH: TSH: 1.3 u[IU]/mL (ref 0.450–4.500)

## 2021-09-04 ENCOUNTER — Telehealth: Payer: Self-pay | Admitting: *Deleted

## 2021-09-04 NOTE — Telephone Encounter (Signed)
-----   Message from Roney Jaffe, [place] sent at 08/31/2021  1:06 PM EST ----- Please call patient and let her know that her thyroid function, kidney and liver function are within normal limits.  She does show signs of dehydration.  She does not show signs of anemia.

## 2021-09-04 NOTE — Telephone Encounter (Signed)
Medical Assistant used [employer] to contact patient.  Interpreter Name: Thurston Hole #: [id number] MA UTR x2 with no VM option.

## 2022-07-20 ENCOUNTER — Encounter (HOSPITAL_COMMUNITY): Payer: Self-pay

## 2022-07-20 ENCOUNTER — Ambulatory Visit (HOSPITAL_COMMUNITY)
Admission: EM | Admit: 2022-07-20 | Discharge: 2022-07-20 | Disposition: A | Discharge status: Home / Self Care | Payer: Self-pay | Attending: Nurse Practitioner | Admitting: Nurse Practitioner

## 2022-07-20 DIAGNOSIS — H60393 Other infective otitis externa, bilateral: Secondary | ICD-10-CM

## 2022-07-20 MED ORDER — NEOMYCIN-POLYMYXIN-HC 3.5-10000-1 OT SUSP: 4.0000 [drp] | Freq: Four times a day (QID) | OTIC | 0 refills | Status: AC

## 2022-07-20 NOTE — ED Triage Notes (Signed)
PT presents with left side ear pain x 3 days.

## 2022-07-20 NOTE — ED Notes (Signed)
Discharged by Dee, CMA.  

## 2022-07-20 NOTE — ED Provider Notes (Signed)
[facility]    CSN: [id number] Arrival date & time: 07/20/22  1228      History   Chief Complaint Chief Complaint  Patient presents with   Ear Pain    HPI Amy [name] is a 41 y.o. female who presents for evaluation of ear pain.  Patient reports 3 days of bilateral ear pain with the right being greater than left.  Endorses clear drainage as well as muffled hearing bilaterally.  Denies any fevers, URI symptoms, recent swimming.  She does use Q-tips often.  Reports a history of ear infections as an adult.  She has not taken any OTC medications for symptoms since onset.  She denies any other concerns at this time.  HPI  Past Medical History:  Diagnosis Date   Medical history non-contributory    Preterm labor     Patient Active Problem List   Diagnosis Date Noted   Syncope 08/31/2021   Stress at home 08/31/2021   Class 1 obesity due to excess calories with body mass index (BMI) of 31.0 to 31.9 in adult 08/31/2021   Tinea versicolor 12/30/2013    Past Surgical History:  Procedure Laterality Date   CERVICAL CERCLAGE N/A 01/13/2014   Procedure: CERCLAGE CERVICAL;  Surgeon: Woodroe Mode, MD;  Location: [facility];  Service: Gynecology;  Laterality: N/A;   CERVICAL CERCLAGE N/A 12/22/2015   Procedure: CERCLAGE CERVICAL;  Surgeon: Lavonia Drafts, MD;  Location: [facility];  Service: Gynecology;  Laterality: N/A;   CESAREAN SECTION     one previous    OB History     Gravida  6   Para  6   Term  5   Preterm  1   AB  0   Living  5      SAB  0   IAB  0   Ectopic  0   Multiple  0   Live Births  6            Home Medications    Prior to Admission medications   Medication Sig Start Date End Date Taking? Authorizing Provider  neomycin-polymyxin-hydrocortisone (CORTISPORIN) 3.5-10000-1 OTIC suspension Place 4 drops into both ears 4 (four) times daily for 7 days. 07/20/22 07/27/22 Yes Ardelle Balls, NP  hydrOXYzine (ATARAX) 25 MG  tablet Take 1 tablet (25 mg total) by mouth at bedtime as needed. 08/30/21   Mayers, Loraine Grip, PA-C    Family History Family History  Problem Relation Age of Onset   Diabetes Father    Diabetes Sister    Hypertension Sister    Heart disease Sister        has pacemaker   Cancer Sister     Social History Social History   Tobacco Use   Smoking status: Never   Smokeless tobacco: Never  Vaping Use   Vaping Use: Never used  Substance Use Topics   Alcohol use: No   Drug use: No     Allergies   Penicillins   Review of Systems Review of Systems  HENT:  Positive for ear discharge and ear pain.      Physical Exam Triage Vital Signs ED Triage Vitals [07/20/22 1319]  Enc Vitals Group     BP 103/60     Pulse Rate 69     Resp 16     Temp 98.2 F (36.8 C)     Temp Source Oral     SpO2 98 %     Weight  Height      Head Circumference      Peak Flow      Pain Score      Pain Loc      Pain Edu?      Excl. in GC?    No data found.  Updated Vital Signs BP 103/60 (BP Location: Left Arm)   Pulse 69   Temp 98.2 F (36.8 C) (Oral)   Resp 16   SpO2 98%   Visual Acuity Right Eye Distance:   Left Eye Distance:   Bilateral Distance:    Right Eye Near:   Left Eye Near:    Bilateral Near:     Physical Exam Vitals and nursing note reviewed.  Constitutional:      General: She is not in acute distress.    Appearance: Normal appearance. She is not ill-appearing.  HENT:     Head: Normocephalic and atraumatic.     Right Ear: Drainage and swelling present. No mastoid tenderness. Tympanic membrane is not perforated or erythematous.     Left Ear: Drainage and swelling present. No mastoid tenderness. Tympanic membrane is perforated. Tympanic membrane is not erythematous.     Ears:     Comments: Bilateral canal swelling with scant mucoid drainage.  TMs visible bilaterally and are intact without erythema or perforation.    Nose: Nose normal.  Eyes:      Conjunctiva/sclera: Conjunctivae normal.     Pupils: Pupils are equal, round, and reactive to light.  Cardiovascular:     Rate and Rhythm: Normal rate.  Skin:    General: Skin is warm and dry.  Neurological:     General: No focal deficit present.     Mental Status: She is alert and oriented to person, place, and time.  Psychiatric:        Mood and Affect: Mood normal.        Behavior: Behavior normal.      UC Treatments / Results  Labs (all labs ordered are listed, but only abnormal results are displayed) Labs Reviewed - No data to display  EKG   Radiology No results found.  Procedures Procedures (including critical care time)  Medications Ordered in UC Medications - No data to display  Initial Impression / Assessment and Plan / UC Course  I have reviewed the triage vital signs and the nursing notes.  Pertinent labs & imaging results that were available during my care of the patient were reviewed by me and considered in my medical decision making (see chart for details).     Cortisporin drops as prescribed Keep water out of ears until treatment is complete OTC analgesics as needed Follow-up with PCP 2 to 3 days for recheck ER precautions reviewed and patient verbalized understanding Final Clinical Impressions(s) / UC Diagnoses   Final diagnoses:  Infective otitis externa of both ears     Discharge Instructions      Antibiotic drops as prescribed Keep water out of your ears until you are completed treatment If your symptoms do not improve or worsen please get rechecked Follow-up with your PCP 2 to 3 days I hope you feel better soon!   ED Prescriptions     Medication Sig Dispense Auth. Provider   neomycin-polymyxin-hydrocortisone (CORTISPORIN) 3.5-10000-1 OTIC suspension Place 4 drops into both ears 4 (four) times daily for 7 days. 10 mL Revonda Standard, NP      PDMP not reviewed this encounter.   Revonda Standard, NP 07/20/22 1400

## 2022-07-20 NOTE — Discharge Instructions (Signed)
Antibiotic drops as prescribed Keep water out of your ears until you are completed treatment If your symptoms do not improve or worsen please get rechecked Follow-up with your PCP 2 to 3 days I hope you feel better soon!

## 2024-06-22 ENCOUNTER — Emergency Department (HOSPITAL_COMMUNITY)
Admission: EM | Admit: 2024-06-22 | Discharge: 2024-06-23 | Disposition: A | Discharge status: Home / Self Care | Payer: Self-pay | Attending: Emergency Medicine | Admitting: Emergency Medicine

## 2024-06-22 ENCOUNTER — Other Ambulatory Visit: Payer: Self-pay

## 2024-06-22 ENCOUNTER — Encounter (HOSPITAL_COMMUNITY): Payer: Self-pay | Admitting: Emergency Medicine

## 2024-06-22 DIAGNOSIS — R509 Fever, unspecified: Secondary | ICD-10-CM | POA: Insufficient documentation

## 2024-06-22 DIAGNOSIS — J020 Streptococcal pharyngitis: Secondary | ICD-10-CM | POA: Insufficient documentation

## 2024-06-22 LAB — RESP PANEL BY RT-PCR (RSV, FLU A&B, COVID)  RVPGX2
Influenza A by PCR: NEGATIVE
Influenza B by PCR: NEGATIVE
Resp Syncytial Virus by PCR: NEGATIVE
SARS Coronavirus 2 by RT PCR: NEGATIVE

## 2024-06-22 LAB — GROUP A STREP BY PCR: Group A Strep by PCR: DETECTED — AB

## 2024-06-22 NOTE — ED Provider Triage Note (Signed)
 Emergency Medicine Provider Triage Evaluation Note  Davyn Elsasser , a 43 y.o. female  was evaluated in triage.  Pt complains of throat discomfort. Report pain to L side of throat, felt like there's a white parasite behind her L tonsil, having headache and L ear pain.  Sxs started today. No fever, chills, cough, or sob  Review of Systems  Positive: As above Negative: As above  Physical Exam  BP 117/72   Pulse 70   Temp 98.2 F (36.8 C) (Oral)   Resp 16   Wt 73.5 kg   SpO2 99%   BMI 31.64 kg/m  Gen:   Awake, no distress   Resp:  Normal effort  MSK:   Moves extremities without difficulty  Other:    Medical Decision Making  Medically screening exam initiated at 7:47 PM.  Appropriate orders placed.  Caralina Lambing was informed that the remainder of the evaluation will be completed by another provider, this initial triage assessment does not replace that evaluation, and the importance of remaining in the ED until their evaluation is complete.     Nivia Colon, PA-C 06/22/24 1947

## 2024-06-22 NOTE — ED Triage Notes (Signed)
 Patient reports she ate something today that cut her tonsil, and also reports that she has seen something white crawling in her throat.  Patient also endorses headache and left ear pain.

## 2024-06-22 NOTE — ED Triage Notes (Signed)
 Patient states she ate something that cut her tonsil. This happened this afternoon. Patient states she was eating a tortilla, she denies bleeding. She states she looked in the mirror and saw it. She also complains of having a headache and she's dizzy.

## 2024-06-23 MED ORDER — AMOXICILLIN 500 MG PO CAPS: 500.0000 mg | ORAL_CAPSULE | Freq: Two times a day (BID) | ORAL | 0 refills | Status: AC

## 2024-06-23 NOTE — ED Provider Notes (Signed)
 [facility] Provider Note   CSN: [id number] Arrival date & time: 06/22/24  1858    Patient presents with: Sore Throat and Headache  Amy [name] is a 43 y.o. female presents to the ED with sore throat and left ear pain that began 3 days ago. The symptoms started gradually and have been progressively worsening since onset. The patient describes the symptoms as sore throat, left ear pain, and pain in her left tonsil.  Patient states that she has been seeing white spots in the back of her throat for the past 2 days.  Patient states associated symptoms include subjective fevers.  The patient reports no nausea, vomiting, diarrhea, difficulty swallowing, difficulty speaking, difficulty breathing, hearing loss.  No recent travel.  No sick contacts.      Sore Throat Associated symptoms include headaches.  Headache     Prior to Admission medications   Medication Sig Start Date End Date Taking? Authorizing Provider  amoxicillin (AMOXIL) 500 MG capsule Take 1 capsule (500 mg total) by mouth 2 (two) times daily for 10 days. 06/23/24 07/03/24 Yes Lujuana Kapler L, PA  hydrOXYzine  (ATARAX ) 25 MG tablet Take 1 tablet (25 mg total) by mouth at bedtime as needed. 08/30/21   Mayers, Cari S, PA-C    Allergies: Penicillins    Review of Systems  Neurological:  Positive for headaches.    Updated Vital Signs BP 106/75 (BP Location: Right Arm)   Pulse 65   Temp 97.8 F (36.6 C) (Oral)   Resp 18   Wt 73.5 kg   SpO2 99%   BMI 31.64 kg/m   Physical Exam Vitals and nursing note reviewed.  Constitutional:      General: She is not in acute distress.    Appearance: Normal appearance.  HENT:     Head: Normocephalic and atraumatic.     Right Ear: Hearing and ear canal normal.     Left Ear: Hearing and ear canal normal. Tympanic membrane is injected and erythematous.     Mouth/Throat:     Pharynx: Oropharyngeal exudate and posterior  oropharyngeal erythema present.     Tonsils: Tonsillar exudate present. No tonsillar abscesses. 1+ on the right. 2+ on the left.     Comments: No signs of trauma. No airway compromise. No signs of abscess. No difficulty speaking during exam or swallowing.  Eyes:     Extraocular Movements: Extraocular movements intact.     Conjunctiva/sclera: Conjunctivae normal.     Pupils: Pupils are equal, round, and reactive to light.  Cardiovascular:     Rate and Rhythm: Normal rate and regular rhythm.     Pulses: Normal pulses.  Pulmonary:     Effort: Pulmonary effort is normal. No respiratory distress.  Abdominal:     General: Abdomen is flat.     Palpations: Abdomen is soft.     Tenderness: There is no abdominal tenderness.  Musculoskeletal:        General: Normal range of motion.     Cervical back: Full passive range of motion without pain and normal range of motion.  Lymphadenopathy:     Head:     Left side of head: Tonsillar adenopathy present.  Skin:    General: Skin is warm and dry.     Capillary Refill: Capillary refill takes less than 2 seconds.  Neurological:     General: No focal deficit present.     Mental Status: She is alert. Mental status is at baseline.  Psychiatric:        Mood and Affect: Mood normal.     (all labs ordered are listed, but only abnormal results are displayed) Labs Reviewed  GROUP A STREP BY PCR - Abnormal; Notable for the following components:      Result Value   Group A Strep by PCR DETECTED (*)    All other components within normal limits  RESP PANEL BY RT-PCR (RSV, FLU A&B, COVID)  [id number]    EKG: None  Radiology: No results found.   Procedures   Medications Ordered in the ED - No data to display                                  Medical Decision Making Risk Prescription drug management.   Patient presents to the ED for concern of sore throat, this involves an extensive number of treatment options, and is a complaint that carries  with it a high risk of complications and morbidity.    The differential diagnosis includes: Viral Etiology/strep Bacterial/Infectious Etiology Trauma   Co morbidities that complicate the patient evaluation: None  Additional history obtained: Additional history obtained from Outside Medical Records and Past Admission  External records from outside source obtained and reviewed including Medical history, Medications, allergies. The patient was a reliable historian, providing a clear, detailed, and consistent account of the presenting symptoms and relevant medical history. The information was obtained directly from the patient and statements were documented in the patient's own words when possible. No discrepancies were noted between the history provided and available collateral sources.    The patient was spanish speaking and a hospital interpretor was used for this visit.   Lab Tests: I ordered, and personally interpreted labs.   The pertinent results include:  Group A strep - positive RESP Panel - Negative  Medicines ordered and prescription drug management: I ordered medications: Amoxicillin was prescribed as the antibiotic of choice - patient states that it is listed in her chart that she has an allergy to penicillin medications, however she states that it is due to her mother having a reaction to it as a child and that she herself has had penicillin medications before without complication and she is unsure why it is in her chart.  I have reviewed the patients home medicines and have made adjustments as needed  Test Considered: Diagnostic testing was considered based on the patient's presenting symptoms, risk factors, and initial clinical assessment.  Given physical exam findings xray not needed to rule out any tonsillar abscess at this time.  The approach to diagnostic testing prioritized exclusion of life-threatening conditions  Problem List / ED Course: Problem List: Sore  throat Eat pain Emergency Department Course: The patient presented with sore throat and ear pain. Initial assessment included history, physical exam, and review of prior medical records. Group A strep was positive. Given patient history, physical exam findings, and laboratory studies - plan to treat outpatient for group A strep pharyngitis.   Dispostion: After consideration of the diagnostic results and physical exam, I feel that the patent would benefit from discharge home, supportive care measures, antibiotic for group A strep pharyngitis, and follow up with PCP.   Clinical Assessment:    Working diagnosis: group A strep pharyngitis.  Disposition Plan: The patient is medically stable for discharge from the Emergency Department at this time. Vital signs are within normal limits, and the patient is alert, oriented,  and in no acute distress. Diagnostic evaluation has been completed with no findings necessitating hospital admission or further emergent workup.  Communication:   Patient informed of disposition decision and rationale. Questions addressed.  The diagnostic results and clinical impression were discussed with the patient and  at bedside and the patient demonstrated understanding.         Final diagnoses:  Strep pharyngitis    ED Discharge Orders          Ordered    amoxicillin (AMOXIL) 500 MG capsule  2 times daily        06/23/24 0131               Willma Duwaine CROME, [place] 06/23/24 2337    Carita Senior, MD 06/24/24 [phone]

## 2024-06-23 NOTE — Discharge Instructions (Addendum)
 Thank you for visiting the Emergency Department today. It was a pleasure to be part of your healthcare team. Your test results showed you are positive for strep throat. You are being treated with an antibiotic, please complete the entire course, and follow-up with your PCP as needed. If you have any questions about your medicines, please call your pharmacy or healthcare provider. At home, rest, hydrate, eat soft foods, and use over-the-counter pain medication such as Tylenol  or ibuprofen  as needed. It is important to watch for warning signs such as worsening pain, fever, or trouble breathing. If any of these happen, return to the Emergency Department or call 911. Please follow up with your primary care provider within 2 weeks if symptoms have not resolved. Thank you for trusting us  with your health.
